# Patient Record
Sex: Female | Born: 2005
Health system: Southern US, Community
[De-identification: ages and names within clinical notes are randomized; demographics above are authoritative.]

## PROBLEM LIST (undated history)

## (undated) DIAGNOSIS — F32A Depression, unspecified: Secondary | ICD-10-CM

## (undated) DIAGNOSIS — R569 Unspecified convulsions: Secondary | ICD-10-CM

## (undated) DIAGNOSIS — N63 Unspecified lump in unspecified breast: Secondary | ICD-10-CM

## (undated) DIAGNOSIS — J45909 Unspecified asthma, uncomplicated: Secondary | ICD-10-CM

## (undated) HISTORY — PX: DENTAL SURGERY: SHX609

---
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
## 2024-01-10 DIAGNOSIS — H5212 Myopia, left eye: Secondary | ICD-10-CM | POA: Diagnosis not present

## 2024-01-11 ENCOUNTER — Encounter: Payer: Self-pay | Admitting: Family Medicine

## 2024-01-11 ENCOUNTER — Ambulatory Visit (INDEPENDENT_AMBULATORY_CARE_PROVIDER_SITE_OTHER): Payer: Commercial Managed Care - PPO | Admitting: Family Medicine

## 2024-01-11 ENCOUNTER — Encounter: Payer: Self-pay | Admitting: Neurology

## 2024-01-11 VITALS — BP 101/71 | HR 85 | Ht 63.78 in | Wt 117.5 lb

## 2024-01-11 DIAGNOSIS — Z1159 Encounter for screening for other viral diseases: Secondary | ICD-10-CM | POA: Diagnosis not present

## 2024-01-11 DIAGNOSIS — Z23 Encounter for immunization: Secondary | ICD-10-CM

## 2024-01-11 DIAGNOSIS — G40909 Epilepsy, unspecified, not intractable, without status epilepticus: Secondary | ICD-10-CM | POA: Diagnosis not present

## 2024-01-11 DIAGNOSIS — Z862 Personal history of diseases of the blood and blood-forming organs and certain disorders involving the immune mechanism: Secondary | ICD-10-CM | POA: Diagnosis not present

## 2024-01-11 DIAGNOSIS — E559 Vitamin D deficiency, unspecified: Secondary | ICD-10-CM | POA: Diagnosis not present

## 2024-01-11 DIAGNOSIS — R5383 Other fatigue: Secondary | ICD-10-CM

## 2024-01-11 DIAGNOSIS — Z113 Encounter for screening for infections with a predominantly sexual mode of transmission: Secondary | ICD-10-CM | POA: Diagnosis not present

## 2024-01-11 NOTE — Patient Instructions (Signed)
I have placed an urgent referral to neurology, I am hoping they will be able to get you in soon!  I would recommend calling the DMV to ask for an extension for your paperwork to be sent back in.  You can let them know that you are in the process of being seen by a new neurologist because of the changes in your insurance.  I would hope that they will work with you.  I will send you a message on MyChart once we have the results of your labs back.

## 2024-01-11 NOTE — Progress Notes (Signed)
Established Patient Office Visit  Subjective   Patient ID: Sheila Watson, female    DOB: [birth year]  Age: 18 y.o. MRN: [id number]  Chief Complaint  Patient presents with   Follow Up Anemia    HPI Sheila Watson is a 18 y.o. female presenting today for follow up of anemia.  Her last appointment with this office discussed repeating iron labs which were never done.  She would like to update her lab work to ensure that she does not need iron infusions again.  She also needs a referral to a neurologist within the [facility] health system.  She was previously seeing pediatric neurology at [facility] but now has a new Brewing technologist.  She reports recent fatigue and needing to sleep more.  She does have a family history of thyroid issues in addition to history of iron deficiency anemia and vitamin D deficiency.  Outpatient Medications Prior to Visit  Medication Sig   norelgestromin-ethinyl estradiol Burr Medico) 150-35 MCG/24HR transdermal patch Place 1 patch onto the skin once a week.   zonisamide (ZONEGRAN) 100 MG capsule Take 3 capsules (300 mg total) by mouth daily.   No facility-administered medications prior to visit.    ROS Negative unless otherwise noted in HPI   Objective:     BP 101/71   Pulse 85   Ht 5' 3.78" (1.62 m)   Wt 117 lb 8 oz (53.3 kg)   LMP 01/10/2024   SpO2 99%   BMI 20.31 kg/m   Physical Exam Constitutional:      General: She is not in acute distress.    Appearance: Normal appearance.  HENT:     Head: Normocephalic and atraumatic.  Cardiovascular:     Rate and Rhythm: Normal rate and regular rhythm.     Heart sounds: No murmur heard.    No friction rub. No gallop.  Pulmonary:     Effort: Pulmonary effort is normal. No respiratory distress.     Breath sounds: No wheezing, rhonchi or rales.  Skin:    General: Skin is warm and dry.  Neurological:     Mental Status: She is alert and oriented to person, place, and time.      Assessment & Plan:  Seizure  disorder [facility]) -     Ambulatory referral to Neurology  Routine screening for STI (sexually transmitted infection) -     Hepatitis C antibody; Future -     GC/Chlamydia Probe Amp; Future  History of iron deficiency anemia -     CBC with Differential/Platelet; Future -     Iron and TIBC; Future -     Ferritin; Future  Screening for viral disease -     Hepatitis C antibody; Future  Vitamin D deficiency -     VITAMIN D 25 Hydroxy (Vit-D Deficiency, Fractures); Future  Fatigue, unspecified type -     TSH Rfx on Abnormal to Free T4; Future  Need for influenza vaccination  Encounter for immunization -     Flu vaccine trivalent PF, 6mos and older(Flulaval,Afluria,Fluarix,Fluzone)  If iron levels low, recommend either iron infusions or finding a formulation that she can tolerate orally.  Referral was made to neurology, recommend calling the DMV to ask for an extension on the paperwork until she is able to be evaluated by neurology.  Most recent appointment/evaluation by pediatric neurologist was September 2023, would like to ensure that management of seizure disorder is still stable.  Return in about 4 months (around 05/10/2024) for annual physical.  Melida Quitter, PA

## 2024-01-12 ENCOUNTER — Other Ambulatory Visit: Payer: Self-pay

## 2024-01-12 ENCOUNTER — Telehealth: Payer: Self-pay | Admitting: Family Medicine

## 2024-01-12 ENCOUNTER — Encounter: Payer: Self-pay | Admitting: Family Medicine

## 2024-01-12 ENCOUNTER — Telehealth: Payer: Self-pay | Admitting: Pharmacy Technician

## 2024-01-12 ENCOUNTER — Other Ambulatory Visit: Payer: Self-pay | Admitting: Family Medicine

## 2024-01-12 DIAGNOSIS — D508 Other iron deficiency anemias: Secondary | ICD-10-CM

## 2024-01-12 DIAGNOSIS — R790 Abnormal level of blood mineral: Secondary | ICD-10-CM

## 2024-01-12 LAB — CBC WITH DIFFERENTIAL/PLATELET
Basophils Absolute: 0.1 10*3/uL (ref 0.0–0.2)
Basos: 1 %
EOS (ABSOLUTE): 0 10*3/uL (ref 0.0–0.4)
Eos: 1 %
Hematocrit: 38 % (ref 34.0–46.6)
Hemoglobin: 11.6 g/dL (ref 11.1–15.9)
Immature Grans (Abs): 0 10*3/uL (ref 0.0–0.1)
Immature Granulocytes: 0 %
Lymphocytes Absolute: 2.3 10*3/uL (ref 0.7–3.1)
Lymphs: 36 %
MCH: 25.4 pg — ABNORMAL LOW (ref 26.6–33.0)
MCHC: 30.5 g/dL — ABNORMAL LOW (ref 31.5–35.7)
MCV: 83 fL (ref 79–97)
Monocytes Absolute: 0.6 10*3/uL (ref 0.1–0.9)
Monocytes: 9 %
Neutrophils Absolute: 3.5 10*3/uL (ref 1.4–7.0)
Neutrophils: 53 %
Platelets: 240 10*3/uL (ref 150–450)
RBC: 4.57 x10E6/uL (ref 3.77–5.28)
RDW: 14.5 % (ref 11.7–15.4)
WBC: 6.4 10*3/uL (ref 3.4–10.8)

## 2024-01-12 LAB — IRON AND TIBC
Iron Saturation: 7 % — CL (ref 15–55)
Iron: 30 ug/dL (ref 27–159)
Total Iron Binding Capacity: 425 ug/dL (ref 250–450)
UIBC: 395 ug/dL (ref 131–425)

## 2024-01-12 LAB — HEPATITIS C ANTIBODY: Hep C Virus Ab: NONREACTIVE

## 2024-01-12 LAB — VITAMIN D 25 HYDROXY (VIT D DEFICIENCY, FRACTURES): Vit D, 25-Hydroxy: 31 ng/mL (ref 30.0–100.0)

## 2024-01-12 LAB — FERRITIN: Ferritin: 4 ng/mL — ABNORMAL LOW (ref 15–77)

## 2024-01-12 LAB — TSH RFX ON ABNORMAL TO FREE T4: TSH: 1.68 u[IU]/mL (ref 0.450–4.500)

## 2024-01-12 NOTE — Telephone Encounter (Addendum)
Auth Submission: NO AUTH NEEDED Site of care: Site of care: [facility] Payer: [employer] Medication & CPT/J Code(s) submitted: Venofer (Iron Sucrose) J1756 Route of submission (phone, fax, portal):  Phone # Fax # Auth type: Buy/Bill PB Units/visits requested: 5 doses Reference number:  Approval from: 01/12/24 to 06/08/24

## 2024-01-12 NOTE — Telephone Encounter (Signed)
Patient referred to infusion pharmacy team for ambulatory infusion of IV iron.  Insurance - [employer]  Dx code - D50.8  IV Iron Therapy - Venofer 200 mg IV x 5  Infusion appointments - Scheduling team will schedule patient as soon as possible.    Demetrius Charity, PharmD

## 2024-01-15 ENCOUNTER — Ambulatory Visit: Payer: Commercial Managed Care - PPO | Admitting: Family Medicine

## 2024-01-15 LAB — GC/CHLAMYDIA PROBE AMP
Chlamydia trachomatis, NAA: NEGATIVE
Neisseria Gonorrhoeae by PCR: NEGATIVE

## 2024-01-17 ENCOUNTER — Ambulatory Visit (INDEPENDENT_AMBULATORY_CARE_PROVIDER_SITE_OTHER): Payer: Commercial Managed Care - PPO

## 2024-01-17 VITALS — BP 104/68 | HR 78 | Temp 97.7°F | Resp 16 | Ht 63.0 in | Wt 118.2 lb

## 2024-01-17 DIAGNOSIS — D509 Iron deficiency anemia, unspecified: Secondary | ICD-10-CM | POA: Diagnosis not present

## 2024-01-17 DIAGNOSIS — D508 Other iron deficiency anemias: Secondary | ICD-10-CM

## 2024-01-17 MED ORDER — ACETAMINOPHEN 325 MG PO TABS
650.0000 mg | ORAL_TABLET | Freq: Once | ORAL | Status: AC
Start: 2024-01-17 — End: 2024-01-17
  Administered 2024-01-17: 650 mg via ORAL
  Filled 2024-01-17: qty 2

## 2024-01-17 MED ORDER — IRON SUCROSE 20 MG/ML IV SOLN
200.0000 mg | Freq: Once | INTRAVENOUS | Status: AC
  Administered 2024-01-17: 200 mg via INTRAVENOUS
  Filled 2024-01-17: qty 10

## 2024-01-17 MED ORDER — DIPHENHYDRAMINE HCL 25 MG PO CAPS
25.0000 mg | ORAL_CAPSULE | Freq: Once | ORAL | Status: AC
Start: 2024-01-17 — End: 2024-01-17
  Administered 2024-01-17: 25 mg via ORAL
  Filled 2024-01-17: qty 1

## 2024-01-17 NOTE — Progress Notes (Signed)
Diagnosis: Iron Deficiency Anemia  Provider:  Chilton Greathouse MD  Procedure: IV Push  IV Type: Peripheral, IV Location: R Antecubital  Venofer (Iron Sucrose), Dose: 200 mg  Post Infusion IV Care: Observation period completed and Peripheral IV Discontinued  Discharge: Condition: Good, Destination: Home . AVS Declined  Performed by:  Rico Ala, LPN

## 2024-01-19 ENCOUNTER — Other Ambulatory Visit: Payer: Self-pay

## 2024-01-19 ENCOUNTER — Ambulatory Visit: Payer: Commercial Managed Care - PPO

## 2024-01-19 VITALS — BP 95/66 | HR 78 | Temp 98.7°F | Resp 16 | Ht 63.0 in | Wt 117.2 lb

## 2024-01-19 DIAGNOSIS — D508 Other iron deficiency anemias: Secondary | ICD-10-CM

## 2024-01-19 DIAGNOSIS — D509 Iron deficiency anemia, unspecified: Secondary | ICD-10-CM | POA: Diagnosis not present

## 2024-01-19 MED ORDER — DIPHENHYDRAMINE HCL 25 MG PO CAPS
25.0000 mg | ORAL_CAPSULE | Freq: Once | ORAL | Status: AC
Start: 2024-01-19 — End: 2024-01-19
  Administered 2024-01-19: 25 mg via ORAL
  Filled 2024-01-19: qty 1

## 2024-01-19 MED ORDER — ACETAMINOPHEN 325 MG PO TABS
650.0000 mg | ORAL_TABLET | Freq: Once | ORAL | Status: AC
  Administered 2024-01-19: 650 mg via ORAL
  Filled 2024-01-19: qty 2

## 2024-01-19 MED ORDER — IRON SUCROSE 20 MG/ML IV SOLN
200.0000 mg | Freq: Once | INTRAVENOUS | Status: AC
  Administered 2024-01-19: 200 mg via INTRAVENOUS
  Filled 2024-01-19: qty 10

## 2024-01-19 NOTE — Progress Notes (Signed)
 Diagnosis: Iron Deficiency Anemia  Provider:  Chilton Greathouse MD  Procedure: IV Push  IV Type: Peripheral, IV Location: L Antecubital  Venofer (Iron Sucrose), Dose: 200 mg  Post Infusion IV Care: Observation period completed and Peripheral IV Discontinued  Discharge: Condition: Good, Destination: Home . AVS Declined  Performed by:  Loney Hering, LPN

## 2024-01-23 ENCOUNTER — Ambulatory Visit: Payer: Commercial Managed Care - PPO | Admitting: *Deleted

## 2024-01-23 VITALS — BP 100/70 | HR 79 | Temp 98.4°F | Resp 16 | Ht 63.0 in | Wt 118.6 lb

## 2024-01-23 DIAGNOSIS — D509 Iron deficiency anemia, unspecified: Secondary | ICD-10-CM

## 2024-01-23 DIAGNOSIS — D508 Other iron deficiency anemias: Secondary | ICD-10-CM

## 2024-01-23 MED ORDER — DIPHENHYDRAMINE HCL 25 MG PO CAPS
25.0000 mg | ORAL_CAPSULE | Freq: Once | ORAL | Status: AC
  Administered 2024-01-23: 25 mg via ORAL
  Filled 2024-01-23: qty 1

## 2024-01-23 MED ORDER — IRON SUCROSE 20 MG/ML IV SOLN
200.0000 mg | Freq: Once | INTRAVENOUS | Status: AC
  Administered 2024-01-23: 200 mg via INTRAVENOUS
  Filled 2024-01-23: qty 10

## 2024-01-23 MED ORDER — ACETAMINOPHEN 325 MG PO TABS
650.0000 mg | ORAL_TABLET | Freq: Once | ORAL | Status: AC
  Administered 2024-01-23: 650 mg via ORAL
  Filled 2024-01-23: qty 2

## 2024-01-23 NOTE — Progress Notes (Signed)
 Diagnosis: Iron Deficiency Anemia  Provider:  Chilton Greathouse MD  Procedure: IV Push  IV Type: Peripheral, IV Location: R Antecubital  Venofer (Iron Sucrose), Dose: 200 mg  Post Infusion IV Care: Observation period completed and Peripheral IV Discontinued  Discharge: Condition: Good, Destination: Home . AVS Declined  Performed by:  Forrest Moron, RN

## 2024-01-25 ENCOUNTER — Ambulatory Visit: Payer: Commercial Managed Care - PPO

## 2024-01-25 MED ORDER — DIPHENHYDRAMINE HCL 25 MG PO CAPS: 25.0000 mg | ORAL_CAPSULE | Freq: Once | ORAL | Status: DC

## 2024-01-25 MED ORDER — IRON SUCROSE 20 MG/ML IV SOLN: 200.0000 mg | Freq: Once | INTRAVENOUS | Status: DC

## 2024-01-25 MED ORDER — ACETAMINOPHEN 325 MG PO TABS: 650.0000 mg | ORAL_TABLET | Freq: Once | ORAL | Status: DC

## 2024-01-26 ENCOUNTER — Encounter: Payer: Self-pay | Admitting: Family Medicine

## 2024-01-30 ENCOUNTER — Ambulatory Visit (INDEPENDENT_AMBULATORY_CARE_PROVIDER_SITE_OTHER): Payer: Commercial Managed Care - PPO | Admitting: Family Medicine

## 2024-01-30 ENCOUNTER — Encounter: Payer: Self-pay | Admitting: Family Medicine

## 2024-01-30 VITALS — BP 104/71 | HR 80 | Ht 63.0 in | Wt 119.4 lb

## 2024-01-30 DIAGNOSIS — N63 Unspecified lump in unspecified breast: Secondary | ICD-10-CM

## 2024-01-30 NOTE — Assessment & Plan Note (Signed)
Small nodular lump directly above the left areola noted on exam.  Similarly sized nodule on the right breast in the upper inner quadrant.  Patient had has noticed the nodule in the right breast.  Most likely fibroadenoma, but will get ultrasound for further evaluation.  Further management depends on ultrasound results.

## 2024-01-30 NOTE — Patient Instructions (Signed)
It was nice to see you today,  We addressed the following topics today: -I am going to order an ultrasound of your breast to evaluate the lump and determine what the next step in management will be - Someone should call you to schedule this in the next 3 to 4 weeks.  If you not hear from somebody let us know.  Have a great day,  Frederic Jericho, MD

## 2024-01-30 NOTE — Progress Notes (Signed)
   Acute Office Visit  Subjective:     Patient ID: Sheila Watson, female    DOB: 02-Sep-2006, 18 y.o.   MRN: [id number]  Chief Complaint  Patient presents with   Breast Pain    HPI Patient is in today for breast lump.  Patient first noticed this approximately 1 year ago and brought it up to her obstetrician.  An exam was done and she was reassured that it was "fibrous tissue".  1 week ago patient noticed sharp pain when lying on her back in the area of the lump.  Pain generally lasts a few seconds to few minutes.  Generally occurs when she is lying on her back.  Patient has a child and has breast-fed in the past.  She notes some clear discharge if she squeezes the nipple but otherwise no discharge.  ROS      Objective:    BP 104/71   Pulse 80   Ht 5\' 3"  (1.6 m)   Wt 119 lb 6.4 oz (54.2 kg)   LMP 01/10/2024   SpO2 100%   BMI 21.15 kg/m    Physical Exam General: Alert, oriented Breast: Patient has a small nodule directly above the left areola approximately 1 cm in diameter.  Similarly sized nodule in the right breast approximately 6 to 7 cm above the areola in the 1 o'clock position.  No other abnormalities noted.  No rash or erythema.  No axillary lymphadenopathy.  No results found for any visits on 01/30/24.      Assessment & Plan:   Mass of breast, unspecified laterality Assessment & Plan: Small nodular lump directly above the left areola noted on exam.  Similarly sized nodule on the right breast in the upper inner quadrant.  Patient had has noticed the nodule in the right breast.  Most likely fibroadenoma, but will get ultrasound for further evaluation.  Further management depends on ultrasound results.  Orders: -     [place] LIMITED ULTRASOUND INCLUDING AXILLA LEFT BREAST ; Future -     US BREAST COMPLETE UNI RIGHT INC AXILLA; Future     Return in about 2 months (around 03/29/2024) for breast lump.  Sandre Kitty, MD

## 2024-01-31 ENCOUNTER — Ambulatory Visit: Payer: Commercial Managed Care - PPO

## 2024-01-31 VITALS — BP 101/68 | HR 85 | Temp 98.0°F | Resp 16 | Ht 63.0 in | Wt 119.8 lb

## 2024-01-31 DIAGNOSIS — D509 Iron deficiency anemia, unspecified: Secondary | ICD-10-CM | POA: Diagnosis not present

## 2024-01-31 DIAGNOSIS — D508 Other iron deficiency anemias: Secondary | ICD-10-CM

## 2024-01-31 MED ORDER — IRON SUCROSE 20 MG/ML IV SOLN
200.0000 mg | Freq: Once | INTRAVENOUS | Status: AC
  Administered 2024-01-31: 200 mg via INTRAVENOUS
  Filled 2024-01-31: qty 10

## 2024-01-31 MED ORDER — DIPHENHYDRAMINE HCL 25 MG PO CAPS
25.0000 mg | ORAL_CAPSULE | Freq: Once | ORAL | Status: AC
  Administered 2024-01-31: 25 mg via ORAL
  Filled 2024-01-31: qty 1

## 2024-01-31 MED ORDER — ACETAMINOPHEN 325 MG PO TABS
650.0000 mg | ORAL_TABLET | Freq: Once | ORAL | Status: AC
  Administered 2024-01-31: 650 mg via ORAL
  Filled 2024-01-31: qty 2

## 2024-01-31 NOTE — Progress Notes (Signed)
Diagnosis: Iron Deficiency Anemia  Provider:  Chilton Greathouse MD  Procedure: IV Push  IV Type: Peripheral, IV Location: R Antecubital  Venofer (Iron Sucrose), Dose: 200 mg  Post Infusion IV Care: Observation period completed and Peripheral IV Discontinued  Discharge: Condition: Good, Destination: Home . AVS Declined  Performed by:  Loney Hering, LPN

## 2024-02-07 ENCOUNTER — Ambulatory Visit: Payer: Commercial Managed Care - PPO

## 2024-02-14 ENCOUNTER — Ambulatory Visit: Payer: Commercial Managed Care - PPO

## 2024-02-14 MED ORDER — ACETAMINOPHEN 325 MG PO TABS: 650.0000 mg | ORAL_TABLET | Freq: Once | ORAL | Status: DC

## 2024-02-14 MED ORDER — DIPHENHYDRAMINE HCL 25 MG PO CAPS: 25.0000 mg | ORAL_CAPSULE | Freq: Once | ORAL | Status: DC

## 2024-02-15 ENCOUNTER — Encounter: Payer: Self-pay | Admitting: Family Medicine

## 2024-02-16 ENCOUNTER — Other Ambulatory Visit (HOSPITAL_COMMUNITY): Payer: Self-pay

## 2024-02-16 ENCOUNTER — Encounter: Payer: Self-pay | Admitting: Neurology

## 2024-02-16 ENCOUNTER — Ambulatory Visit (INDEPENDENT_AMBULATORY_CARE_PROVIDER_SITE_OTHER): Payer: Commercial Managed Care - PPO | Admitting: Neurology

## 2024-02-16 VITALS — BP 105/68 | HR 95 | Ht 63.0 in | Wt 121.6 lb

## 2024-02-16 DIAGNOSIS — G40B09 Juvenile myoclonic epilepsy, not intractable, without status epilepticus: Secondary | ICD-10-CM | POA: Diagnosis not present

## 2024-02-16 MED ORDER — ZONISAMIDE 100 MG PO CAPS
ORAL_CAPSULE | ORAL | 3 refills | Status: DC
  Filled 2024-02-16 – 2024-03-02 (×2): qty 270, 90d supply, fill #0

## 2024-02-16 NOTE — Progress Notes (Signed)
 NEUROLOGY CONSULTATION NOTE  Sheila Watson MRN: [id number] DOB: 05-04-2006  Referring provider: Saralyn Pilar, PA Primary care provider: Dr. Lenor Coffin  Reason for consult:  establish adult epilepsy care  Thank you for your kind referral of Sheila Watson for consultation of the above symptoms. Although her history is well known to you, please allow me to reiterate it for the purpose of our medical record. She is alone in the office today. Records and images were personally reviewed where available.   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 Records from her pediatric neurologist at [facility], Dr. Irene Limbo, were reviewed. Her first and only generalized convulsion was a [place]urnal [place]. She was noted to be unresponsive with gurgling noises, stiff extremities with jerking, foaming at the mouth, for 1-2 minutes. She was disoriented after with note of tongue bite. EEG in 11/2020 reported occasional bursts of sharply contoured high amplitude generalized slowing, photoparoxysmal responses that did not outlast IPS with a run of spike and slow waves maximal in the occipital leads O1>O2 where she did not remember a verbal prompt. MRI brain with and without contrast at [facility] in 12/2020 was normal.   She reports that prior to the convulsion, she was having episodes of myoclonus with quick upper body jerks when exposed to sunlight through the trees in car rides. When younger, her mother had noticed she would have brief staring. She denies any further myoclonus since starting seizure medications. She denies any unresponsive episodes, gaps in time, olfactory/gustatory hallucinations, rising epigastric sensation, focal numbness/tingling/weakness. She was initially on Keppra XR, then switched to Zonisamide in 2022 due to "becoming hateful" on the Keppra. She  has been tolerating the Zonisamide 300mg  qhs without side effects. She denies any headaches, dizziness, diplopia, dysarthria, dysphagia, neck/back pain, bowel/bladder dysfunction. No anosmia, tremors, no falls. She usually gets 8-10 hours of sleep. No alcohol. She is finishing high school online this year. Memory is pretty good. Mood is good, sometimes she gets irritable. She lives with her mother, brother, and 41 year old son [name]. She is driving.   Epilepsy Risk Factors:  Her maternal grandmother had one seizure in early adolescence, mother's cousin had multiple seizures as an adult, 2 maternal cousins have had (?febrile) seizures as infants and toddlers. She had a normal birth and early development.  There is no history of febrile convulsions, CNS infections such as meningitis/encephalitis, significant traumatic brain injury, neurosurgical procedures.  Prior AEDs: Keppra XR (mood changes)  Diagnostic Data: EEG in 11/2020 reported occasional bursts of sharply contoured high amplitude generalized slowing, photoparoxysmal responses that did not outlast IPS with a run of spike and slow waves maximal in the occipital leads O1>O2 where she did not remember a verbal prompt.   MRI brain with and without contrast at [facility] in 12/2020 was normal.    PAST MEDICAL HISTORY: Past Medical History:  Diagnosis Date   Seizures (HCC)     PAST SURGICAL HISTORY: Past Surgical History:  Procedure Laterality Date   DENTAL SURGERY      MEDICATIONS: Current Outpatient Medications on File Prior to Visit  Medication Sig Dispense Refill   norelgestromin-ethinyl estradiol Burr Medico) 150-35 MCG/24HR transdermal patch Place 1 patch onto the skin once a week. 9 patch 4   zonisamide (ZONEGRAN) 100 MG capsule Take 3 capsules (300 mg total) by mouth daily. 90 capsule 5  No current facility-administered medications on file prior to visit.    ALLERGIES: Allergies  Allergen Reactions   Sulfa Antibiotics Rash    Sulfasalazine Rash    FAMILY HISTORY: Family History  Problem Relation Age of Onset   Seizures Maternal Grandmother    Heart disease Maternal Grandfather    Cancer Neg Hx    Diabetes Neg Hx    Hypertension Neg Hx     SOCIAL HISTORY: Social History   Socioeconomic History   Marital status: Single    Spouse name: Not on file   Number of children: Not on file   Years of education: Not on file   Highest education level: Not on file  Occupational History   Not on file  Tobacco Use   Smoking status: Never    Passive exposure: Never   Smokeless tobacco: Never  Vaping Use   Vaping status: Never Used  Substance and Sexual Activity   Alcohol use: No   Drug use: No   Sexual activity: Yes  Other Topics Concern   Not on file  Social History Narrative   Are you right handed or left handed? Right    Are you currently employed ? 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 No    Who lives with you? Lives with mom    What type of home do you live in: 1 story or 2 story? 1 story        Social Drivers of Corporate investment banker Strain: Not on file  Food Insecurity: Not on file  Transportation Needs: Not on file  Physical Activity: Not on file  Stress: Not on file  Social Connections: Unknown (02/14/2023)   Received from [facility], [facility]   Social Network    Social Network: Not on file  Intimate Partner Violence: Unknown (02/14/2023)   Received from [facility], [facility]   HITS    Physically Hurt: Not on file    Insult or Talk Down To: Not on file    Threaten Physical Harm: Not on file    Scream or Curse: Not on file     PHYSICAL EXAM: Vitals:   02/16/24 0850  BP: 105/68  Pulse: 95  SpO2: 98%   General: No acute distress Head:  Normocephalic/atraumatic Skin/Extremities: No rash, no edema Neurological Exam: Mental status: alert and oriented to person, place, and time, no dysarthria or aphasia, Fund of  knowledge is appropriate.  Recent and remote memory are intact, 3/3 delayed recall.  Attention and concentration are normal, 5/5 WORLD backwards.  Cranial nerves: CN I: not tested CN II: pupils equal, round, visual fields intact CN III, IV, VI:  full range of motion, no nystagmus, no ptosis CN V: facial sensation intact CN VII: upper and lower face symmetric CN VIII: hearing intact to conversation Bulk & Tone: normal, no fasciculations. Motor: 5/5 throughout with no pronator drift. Sensation: intact to light touch, cold, pin, vibration sense.  No extinction to double simultaneous stimulation.  Romberg test negative Deep Tendon Reflexes: +2 throughout Cerebellar: no incoordination on finger to nose testing Gait: narrow-based and steady, able to tandem walk adequately. 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 EEG in 2021 showed photoparoxysmal response. MRI brain normal. No further convulsions or myoclonic jerks since 04/2020. She is doing well on Zonisamide 300mg  at  bedtime. We discussed concerns about Zonisamide causing contraception failure in the past, on review of interactions, it is level 4 (minor), with Zonisamide potentially increasing the serum concentration of ethinyl estradiol. Would recommend continuation of medication, as well as starting folic acid 1mg  daily if sexually active. We discussed issues in women with epilepsy. She has DMV paperwork, baseline Zonisamide level will be ordered. She is aware of [place] driving laws o stop driving after a seizure, until 6 months seizure-free. Follow-up in 1 year, call for any changes.    Thank you for allowing me to participate in the care of this patient. Please do not hesitate to call for any questions or concerns.   Patrcia Dolly, M.D.  CC: Dr. Constance Goltz, Saralyn Pilar, [place]

## 2024-02-16 NOTE — Patient Instructions (Signed)
 Good to meet you.  Have bloodwork done for Zonisamide level  2. Continue Zonisamide 100mg : Take 3 capsules every night  3. Start a daily folic acid 1mg  tablet  4. Follow-up in 1 year, call for any changes   Seizure Precautions: 1. If medication has been prescribed for you to prevent seizures, take it exactly as directed.  Do not stop taking the medicine without talking to your doctor first, even if you have not had a seizure in a long time.   2. Avoid activities in which a seizure would cause danger to yourself or to others.  Don't operate dangerous machinery, swim alone, or climb in high or dangerous places, such as on ladders, roofs, or girders.  Do not drive unless your doctor says you may.  3. If you have any warning that you may have a seizure, lay down in a safe place where you can't hurt yourself.    4.  No driving for 6 months from last seizure, as per [facility].   Please refer to the following link on the Epilepsy Foundation of America's website for more information: http://www.epilepsyfoundation.org/answerplace/Social/driving/drivingu.cfm   5.  Maintain good sleep hygiene. Avoid alcohol.  6.  Notify your neurology if you are planning pregnancy or if you become pregnant.  7.  Contact your doctor if you have any problems that may be related to the medicine you are taking.  8.  Call 911 and bring the patient back to the ED if:        A.  The seizure lasts longer than 5 minutes.       B.  The patient doesn't awaken shortly after the seizure  C.  The patient has new problems such as difficulty seeing, speaking or moving  D.  The patient was injured during the seizure  E.  The patient has a temperature over 102 F (39C)  F.  The patient vomited and now is having trouble breathing

## 2024-02-17 ENCOUNTER — Emergency Department (HOSPITAL_COMMUNITY)
Admission: EM | Admit: 2024-02-17 | Discharge: 2024-02-17 | Disposition: A | Discharge status: Home / Self Care | Attending: Emergency Medicine | Admitting: Emergency Medicine

## 2024-02-17 ENCOUNTER — Other Ambulatory Visit: Payer: Self-pay

## 2024-02-17 DIAGNOSIS — R569 Unspecified convulsions: Secondary | ICD-10-CM | POA: Diagnosis present

## 2024-02-17 DIAGNOSIS — Z91148 Patient's other noncompliance with medication regimen for other reason: Secondary | ICD-10-CM | POA: Diagnosis not present

## 2024-02-17 DIAGNOSIS — R000 Tachycardia, unspecified: Secondary | ICD-10-CM | POA: Diagnosis not present

## 2024-02-17 DIAGNOSIS — R9431 Abnormal electrocardiogram [ECG] [EKG]: Secondary | ICD-10-CM | POA: Diagnosis not present

## 2024-02-17 LAB — COMPREHENSIVE METABOLIC PANEL
ALT: 15 U/L (ref 0–44)
AST: 15 U/L (ref 15–41)
Albumin: 3.6 g/dL (ref 3.5–5.0)
Alkaline Phosphatase: 45 U/L (ref 38–126)
Anion gap: 7 (ref 5–15)
BUN: 13 mg/dL (ref 6–20)
CO2: 25 mmol/L (ref 22–32)
Calcium: 8.2 mg/dL — ABNORMAL LOW (ref 8.9–10.3)
Chloride: 105 mmol/L (ref 98–111)
Creatinine, Ser: 0.62 mg/dL (ref 0.44–1.00)
GFR, Estimated: >60 mL/min (ref >60)
Glucose, Bld: 76 mg/dL (ref 70–99)
Potassium: 3.6 mmol/L (ref 3.5–5.1)
Sodium: 137 mmol/L (ref 135–145)
Total Bilirubin: 1.6 mg/dL — ABNORMAL HIGH (ref 0.0–1.2)
Total Protein: 7 g/dL (ref 6.5–8.1)

## 2024-02-17 LAB — URINALYSIS, ROUTINE W REFLEX MICROSCOPIC
Bilirubin Urine: NEGATIVE
Glucose, UA: NEGATIVE mg/dL
Ketones, ur: NEGATIVE mg/dL
Nitrite: NEGATIVE
Protein, ur: 30 mg/dL — AB
Specific Gravity, Urine: 1.026 (ref 1.005–1.030)
pH: 5 (ref 5.0–8.0)

## 2024-02-17 LAB — CBG MONITORING, ED: Glucose-Capillary: 82 mg/dL (ref 70–99)

## 2024-02-17 LAB — CBC WITH DIFFERENTIAL/PLATELET
Abs Immature Granulocytes: 0.05 10*3/uL (ref 0.00–0.07)
Basophils Absolute: 0.1 10*3/uL (ref 0.0–0.1)
Basophils Relative: 1 %
Eosinophils Absolute: 0 10*3/uL (ref 0.0–0.5)
Eosinophils Relative: 0 %
HCT: 40.9 % (ref 36.0–46.0)
Hemoglobin: 12.4 g/dL (ref 12.0–15.0)
Immature Granulocytes: 1 %
Lymphocytes Relative: 22 %
Lymphs Abs: 2.2 10*3/uL (ref 0.7–4.0)
MCH: 27.8 pg (ref 26.0–34.0)
MCHC: 30.3 g/dL (ref 30.0–36.0)
MCV: 91.7 fL (ref 80.0–100.0)
Monocytes Absolute: 0.7 10*3/uL (ref 0.1–1.0)
Monocytes Relative: 7 %
Neutro Abs: 6.9 10*3/uL (ref 1.7–7.7)
Neutrophils Relative %: 69 %
Platelets: 176 10*3/uL (ref 150–400)
RBC: 4.46 MIL/uL (ref 3.87–5.11)
RDW: 18 % — ABNORMAL HIGH (ref 11.5–15.5)
WBC: 9.8 10*3/uL (ref 4.0–10.5)
nRBC: 0 % (ref 0.0–0.2)

## 2024-02-17 LAB — HCG, SERUM, QUALITATIVE: Preg, Serum: NEGATIVE

## 2024-02-17 MED ORDER — ONDANSETRON HCL 4 MG/2ML IJ SOLN
4.0000 mg | Freq: Once | INTRAMUSCULAR | Status: AC
  Administered 2024-02-17: 4 mg via INTRAVENOUS
  Filled 2024-02-17: qty 2

## 2024-02-17 MED ORDER — ACETAMINOPHEN 500 MG PO TABS
500.0000 mg | ORAL_TABLET | Freq: Once | ORAL | Status: AC
  Administered 2024-02-17: 500 mg via ORAL
  Filled 2024-02-17: qty 1

## 2024-02-17 MED ORDER — ZONISAMIDE 100 MG PO CAPS
300.0000 mg | ORAL_CAPSULE | Freq: Once | ORAL | Status: AC
  Administered 2024-02-17: 300 mg via ORAL
  Filled 2024-02-17: qty 3

## 2024-02-17 NOTE — Discharge Instructions (Signed)
 Continue to take your seizure medicine.  Take your normal dose tonight.  Call your neurologist about potential changes in medicine if you are worried about it.

## 2024-02-17 NOTE — ED Triage Notes (Signed)
 Pt presents to the ED via [facility] with complaints of seizure-like activity that lasted ~ 2 mins PTA per the patients BF. Hx of epilepsy and is followed by Neurology. Per Mom, the patient hasn't had seizure like activity since 2021. Pt is prescribed Zonisamide for seizures but has been non-compliant over the last few weeks. A&Ox4 at this time. Denies CP or SOB.    CBG-89 108/68 90 99% RA 18RAC

## 2024-02-17 NOTE — ED Provider Notes (Signed)
 [place] EMERGENCY DEPARTMENT AT [facility] Provider Note   CSN: [id number] Arrival date & time: 02/17/24  6578     History  Chief Complaint  Patient presents with   Seizures    Sheila Watson is a 18 y.o. female.   Seizures Patient presents with seizure.  History of same.  Saw neurology yesterday.  Had been on Zonegran.  However patient has not been taking the medicine.  States she had not had a seizure and 3 to 4 years have been taking it.  Did have seizure last night.  Typical tonic-clonic seizure.  Does have some nausea at this time.  There was talk about potentially changing seizure medicine but had not been done.  Denies pregnancy.    Past Medical History:  Diagnosis Date   Seizures (HCC)     Home Medications Prior to Admission medications   Medication Sig Start Date End Date Taking? Authorizing Provider  norelgestromin-ethinyl estradiol Burr Medico) 150-35 MCG/24HR transdermal patch Place 1 patch onto the skin once a week. 08/11/23     zonisamide (ZONEGRAN) 100 MG capsule Take 3 capsules every night 02/16/24   Van Clines, MD      Allergies    Sulfa antibiotics and Sulfasalazine    Review of Systems   Review of Systems  Neurological:  Positive for seizures.    Physical Exam Updated Vital Signs BP 106/66   Pulse 74   Temp 98.3 F (36.8 C) (Oral)   Resp 19   Ht 5\' 3"  (1.6 m)   Wt 54.4 kg   SpO2 99%   BMI 21.26 kg/m  Physical Exam Vitals and nursing note reviewed.  Cardiovascular:     Rate and Rhythm: Regular rhythm.  Pulmonary:     Breath sounds: No wheezing.  Abdominal:     Tenderness: There is no abdominal tenderness.  Musculoskeletal:     Cervical back: Neck supple.  Skin:    Capillary Refill: Capillary refill takes less than 2 seconds.  Neurological:     Mental Status: She is alert. Mental status is at baseline.     ED Results / Procedures / Treatments   Labs (all labs ordered are listed, but only abnormal results are  displayed) Labs Reviewed  COMPREHENSIVE METABOLIC PANEL - Abnormal; Notable for the following components:      Result Value   Calcium 8.2 (*)    Total Bilirubin 1.6 (*)    All other components within normal limits  CBC WITH DIFFERENTIAL/PLATELET - Abnormal; Notable for the following components:   RDW 18.0 (*)    All other components within normal limits  HCG, SERUM, QUALITATIVE  URINALYSIS, ROUTINE W REFLEX MICROSCOPIC  CBG MONITORING, ED    EKG EKG Interpretation Date/Time:  Saturday February 17 2024 06:26:46 EST Ventricular Rate:  76 PR Interval:  159 QRS Duration:  85 QT Interval:  381 QTC Calculation: 429 R Axis:   63  Text Interpretation: Sinus rhythm RSR' in V1 or V2, probably normal variant Confirmed by Nicanor Alcon, April ([id number]) on 02/17/2024 6:41:21 AM  Radiology No results found.  Procedures Procedures    Medications Ordered in ED Medications  zonisamide (ZONEGRAN) capsule 300 mg (300 mg Oral Given 02/17/24 0741)  ondansetron (ZOFRAN) injection 4 mg (4 mg Intravenous Given 02/17/24 0741)  acetaminophen (TYLENOL) tablet 500 mg (500 mg Oral Given 02/17/24 0845)    ED Course/ Medical Decision Making/ A&P  Medical Decision Making Amount and/or Complexity of Data Reviewed Labs: ordered.  Risk OTC drugs. Prescription drug management.   Patient with seizure activity.  History of same.  Noncompliant with her medication.  No apparent injury.  I think most likely cause is the noncompliance.  Will give oral Zonegran here and monitor.  Blood work reassuring.  Not pregnant.  No further seizures.  Gave dose here and can have dose at home again tonight.  Discharge home.  Will call neurology about potentially changing her medicine if she is uncomfortable with what she has.        Final Clinical Impression(s) / ED Diagnoses Final diagnoses:  Seizure (HCC)  Noncompliance w/medication treatment due to intermit use of medication    Rx / DC  Orders ED Discharge Orders     None         Benjiman Core, MD 02/17/24 [phone]

## 2024-02-21 ENCOUNTER — Other Ambulatory Visit: Payer: Self-pay | Admitting: Family Medicine

## 2024-02-21 ENCOUNTER — Ambulatory Visit
Admission: RE | Admit: 2024-02-21 | Discharge: 2024-02-21 | Disposition: A | Discharge status: Home / Self Care | Payer: Commercial Managed Care - PPO | Source: Ambulatory Visit | Attending: Family Medicine | Admitting: Family Medicine

## 2024-02-21 ENCOUNTER — Ambulatory Visit
Admission: RE | Admit: 2024-02-21 | Discharge: 2024-02-21 | Disposition: A | Discharge status: Home / Self Care | Payer: Commercial Managed Care - PPO | Source: Ambulatory Visit | Attending: Family Medicine

## 2024-02-21 DIAGNOSIS — N6322 Unspecified lump in the left breast, upper inner quadrant: Secondary | ICD-10-CM | POA: Diagnosis not present

## 2024-02-21 DIAGNOSIS — N63 Unspecified lump in unspecified breast: Secondary | ICD-10-CM

## 2024-02-21 DIAGNOSIS — N6310 Unspecified lump in the right breast, unspecified quadrant: Secondary | ICD-10-CM | POA: Diagnosis not present

## 2024-02-26 ENCOUNTER — Encounter: Payer: Self-pay | Admitting: Neurology

## 2024-02-26 ENCOUNTER — Ambulatory Visit (INDEPENDENT_AMBULATORY_CARE_PROVIDER_SITE_OTHER): Payer: Self-pay | Admitting: Neurology

## 2024-02-26 ENCOUNTER — Other Ambulatory Visit (HOSPITAL_COMMUNITY): Payer: Self-pay

## 2024-02-26 VITALS — BP 101/70 | HR 86 | Ht 63.0 in | Wt 120.0 lb

## 2024-02-26 DIAGNOSIS — G40B09 Juvenile myoclonic epilepsy, not intractable, without status epilepticus: Secondary | ICD-10-CM | POA: Diagnosis not present

## 2024-02-26 MED ORDER — VALTOCO 15 MG DOSE 2 X 7.5 MG/0.1ML NA LQPK
NASAL | 5 refills | Status: DC
  Filled 2024-02-26: qty 10, 30d supply, fill #0

## 2024-02-26 NOTE — Patient Instructions (Addendum)
 I hope you start feeling better soon. Continue Zonisamide 100mg : take 3 capsules every night. Refills sent for Valtoco nasal spray for seizure rescue.  Schedule 1-hour EEG  Here is the information for the EpiMonitor watch: [email or web address]  Follow-up in 3 months, call for any changes   Seizure Precautions: 1. If medication has been prescribed for you to prevent seizures, take it exactly as directed.  Do not stop taking the medicine without talking to your doctor first, even if you have not had a seizure in a long time.   2. Avoid activities in which a seizure would cause danger to yourself or to others.  Don't operate dangerous machinery, swim alone, or climb in high or dangerous places, such as on ladders, roofs, or girders.  Do not drive unless your doctor says you may.  3. If you have any warning that you may have a seizure, lay down in a safe place where you can't hurt yourself.    4.  No driving for 6 months from last seizure, as per [facility].   Please refer to the following link on the Epilepsy Foundation of America's website for more information: http://www.epilepsyfoundation.org/answerplace/Social/driving/drivingu.cfm   5.  Maintain good sleep hygiene. Avoid alcohol.  6.  Notify your neurology if you are planning pregnancy or if you become pregnant.  7.  Contact your doctor if you have any problems that may be related to the medicine you are taking.  8.  Call 911 and bring the patient back to the ED if:        A.  The seizure lasts longer than 5 minutes.       B.  The patient doesn't awaken shortly after the seizure  C.  The patient has new problems such as difficulty seeing, speaking or moving  D.  The patient was injured during the seizure  E.  The patient has a temperature over 102 F (39C)  F.  The patient vomited and now is having trouble breathing

## 2024-02-26 NOTE — Progress Notes (Signed)
 NEUROLOGY FOLLOW UP OFFICE NOTE  Sheila Watson [id number] [birth year]  HISTORY OF PRESENT ILLNESS: I had the pleasure of seeing Sheila Watson in follow-up in the neurology clinic on 02/26/2024.  The patient was last seen 2 weeks ago for JME and presents for an urgent visit after a breakthrough seizure on 02/17/24 after being seizure-free for 4 years. She is accompanied by her mother who helps supplement the history today.  Records and images were personally reviewed where available.  On her initial visit, she reported taking Zonisamide 300mg  at bedtime. Today she admits she has not been taking it for a year. The seizure on 3/1 occurred in her sleep, she was not feeling good that day and took a nap. No tongue bite, incontinence, focal weakness. Since the seizure, she has been having trouble remembering things and having difficulty putting words together. She denies any myoclonic jerks but her mother remembers she was complaining of feeling funny again in the car with light coming through the trees. Her mother also noticed a little bit of staring. She has been taking Zonisamide 300mg  at bedtime since the seizure.    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 Records from her pediatric neurologist at [facility], Sheila Watson, were reviewed. Her first and only generalized convulsion was a nocturnal seizure [place]20 [place]. She was noted to be unresponsive with gurgling noises, stiff extremities with jerking, foaming at the mouth, for 1-2 minutes. She was disoriented after with note of tongue bite. EEG in 11/2020 reported occasional bursts of sharply contoured high amplitude generalized slowing, photoparoxysmal responses that did not outlast IPS with a run of spike and slow waves maximal in the occipital leads O1>O2 where she did not remember a verbal prompt. MRI brain with  and without contrast at [facility] in 12/2020 was normal.   She reports that prior to the convulsion, she was having episodes of myoclonus with quick upper body jerks when exposed to sunlight through the trees in car rides. When younger, her mother had noticed she would have brief staring. She denies any further myoclonus since starting seizure medications. She denies any unresponsive episodes, gaps in time, olfactory/gustatory hallucinations, rising epigastric sensation, focal numbness/tingling/weakness. She was initially on Keppra XR, then switched to Zonisamide in 2022 due to "becoming hateful" on the Keppra. She has been tolerating the Zonisamide 300mg  qhs without side effects. She denies any headaches, dizziness, diplopia, dysarthria, dysphagia, neck/back pain, bowel/bladder dysfunction. No anosmia, tremors, no falls. She usually gets 8-10 hours of sleep. No alcohol. She is finishing high school online this year. Memory is pretty good. Mood is good, sometimes she gets irritable. She lives with her mother, brother, and 26 year old son [name]. She is driving.   Epilepsy Risk Factors:  Her maternal grandmother had one seizure in early adolescence, mother's cousin had multiple seizures as an adult, 2 maternal cousins have had (?febrile) seizures as infants and toddlers. She had a normal birth and early development.  There is no history of febrile convulsions, CNS infections such as meningitis/encephalitis, significant traumatic brain injury, neurosurgical procedures.  Prior AEDs: Keppra XR (mood changes)  Diagnostic Data: EEG in 11/2020 reported occasional bursts of sharply contoured high amplitude generalized slowing, photoparoxysmal responses that did not outlast IPS with a run of spike and slow waves maximal in the occipital leads O1>O2 where she did not remember a verbal prompt.  MRI brain with and without contrast at [facility] in 12/2020 was normal.    PAST MEDICAL HISTORY: Past Medical  History:  Diagnosis Date   Seizures (HCC)     MEDICATIONS: Current Outpatient Medications on File Prior to Visit  Medication Sig Dispense Refill   norelgestromin-ethinyl estradiol [facility]) 150-35 MCG/24HR transdermal patch Place 1 patch onto the skin once a week. 9 patch 4   zonisamide (ZONEGRAN) 100 MG capsule Take 3 capsules every night 270 capsule 3   No current facility-administered medications on file prior to visit.    ALLERGIES: Allergies  Allergen Reactions   Sulfa Antibiotics Rash   Sulfasalazine Rash    FAMILY HISTORY: Family History  Problem Relation Age of Onset   Seizures Maternal Grandmother    Heart disease Maternal Grandfather    Cancer Neg Hx    Diabetes Neg Hx    Hypertension Neg Hx     SOCIAL HISTORY: Social History   Socioeconomic History   Marital status: Single    Spouse name: Not on file   Number of children: Not on file   Years of education: Not on file   Highest education level: Not on file  Occupational History   Not on file  Tobacco Use   Smoking status: Never    Passive exposure: Never   Smokeless tobacco: Never  Vaping Use   Vaping status: Never Used  Substance and Sexual Activity   Alcohol use: No   Drug use: No   Sexual activity: Yes  Other Topics Concern   Not on file  Social History Narrative   Are you right handed or left handed? Right    Are you currently employed ? 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 No    Who lives with you? Lives with mom    What type of home do you live in: 1 story or 2 story? 1 story        Social Drivers of Corporate investment banker Strain: Not on file  Food Insecurity: Not on file  Transportation Needs: Not on file  Physical Activity: Not on file  Stress: Not on file  Social Connections: Unknown (02/14/2023)   Received from [facility], [facility]   Social Network    Social Network: Not on file  Intimate Partner Violence: Unknown  (02/14/2023)   Received from [facility], [facility]   HITS    Physically Hurt: Not on file    Insult or Talk Down To: Not on file    Threaten Physical Harm: Not on file    Scream or Curse: Not on file     PHYSICAL EXAM: Vitals:   02/26/24 1414  BP: 101/70  Pulse: 86  SpO2: 99%   General: No acute distress, tearful Head:  Normocephalic/atraumatic Skin/Extremities: No rash, no edema Neurological Exam: alert and awake. No aphasia or dysarthria. Fund of knowledge is appropriate.  Attention and concentration are normal.   Cranial nerves: Pupils equal, round. Extraocular movements intact.  No facial asymmetry.  Motor: moves all extremities symmetrically at least anti-gravity x 4. Gait narrow-based and steady   IMPRESSION: This is a pleasant 18 yo RH woman with Juvenile Myoclonic Epilepsy. EEG in 2021 showed photoparoxysmal response. MRI brain normal. She had been seizure-free for almost 4 years until a nocturnal convulsion on 02/17/24. She now admits that she had stopped the Zonisamide a year ago but restarted it after the  seizure. Since the seizure, she continues to have trouble remembering and word-finding difficulties. A 1-hour EEG will be ordered. Continue Zonisamide 300mg  at bedtime, we discussed the importance of medication compliance. She was understandably upset, particularly about driving restrictions. We discussed prognosis of JME, 90% are controlled on medication. We discussed the importance of avoiding seizure triggers, including missing medication, sleep deprivation, alcohol. She was given a prescription for prn Valtoco for seizure rescue, side effects discussed. They were interested in a seizure alert device, information for EpiMonitor provided today. Follow-up in 3 months, call for any changes.   Thank you for allowing me to participate in her care.  Please do not hesitate to call for any questions or concerns.   Patrcia Dolly, M.D.   CC: Dr. Constance Goltz

## 2024-02-28 ENCOUNTER — Ambulatory Visit (INDEPENDENT_AMBULATORY_CARE_PROVIDER_SITE_OTHER): Payer: Self-pay | Admitting: Neurology

## 2024-02-28 DIAGNOSIS — G40B09 Juvenile myoclonic epilepsy, not intractable, without status epilepticus: Secondary | ICD-10-CM

## 2024-02-28 NOTE — Progress Notes (Unsigned)
 EEG complete - results pending

## 2024-03-02 ENCOUNTER — Other Ambulatory Visit (HOSPITAL_COMMUNITY): Payer: Self-pay

## 2024-03-11 ENCOUNTER — Ambulatory Visit: Payer: Self-pay | Admitting: *Deleted

## 2024-03-11 NOTE — Telephone Encounter (Signed)
 Patient's mother on DPR and not with patient now.  Chief Complaint: patient mother calling to report she thinks medication for seizure might be causing patient thoughts of hurting self.  Symptoms: last few days reports "doesn't want to be here anymore". Gets upset easily "over nothing". Her puppies barking yesterday and "set her off". Patient at her home now with 19 year old child and patient's brother who is asleep.  Frequency: last few days  Pertinent Negatives: Patient denies chest pain no difficulty breathing  Disposition: [x] ED /[] Urgent Care (no appt availability in office) / [] Appointment(In office/virtual)/ []  [facility]/ [] Home Care/ [] Refused Recommended Disposition /[] [facility]/ []  Follow-up with PCP Additional Notes:   Recommended patient's mother to contact pt brother to check on patient. If not able to wake up brother call 911 for wellness check and notify patient expressed hurting self. Unsure of what patient's mother will do. Pt mother reports she does not think patient will answer phone . Please advise.  CAL notified recommended to contact 911.       Copied from CRM [id number]. Topic: Clinical - Red Word Triage >> Mar 11, 2024  1:20 PM Elle L wrote: Red Word that prompted transfer to Nurse Triage: The patient's Vallery Sa [phone], states that the patient has been threatening to hurt herself. She is unsure if it is her seizure medication causing it. Reason for Disposition  Sounds like a life-threatening emergency to the triager  Answer Assessment - Initial Assessment Questions 1. CONCERN: "Did anything happen that prompted you to call today?"      Patient's mother not with patient now, reports patient expressing thoughts of hurting self and patient "doesn't want to be here anymore". 2. ANXIETY SYMPTOMS: "Can you describe how you (your loved one; patient) have been feeling?" (e.g., tense, restless, panicky, anxious, keyed up, overwhelmed, sense of  impending doom).      Reports patient hs acting different , easily upset, "flipping out over nothing'. 3. ONSET: "How long have you been feeling this way?" (e.g., hours, days, weeks)     Last few days  4. SEVERITY: "How would you rate the level of anxiety?" (e.g., 0 - 10; or mild, moderate, severe).     Na  5. FUNCTIONAL IMPAIRMENT: "How have these feelings affected your ability to do daily activities?" "Have you had more difficulty than usual doing your normal daily activities?" (e.g., getting better, same, worse; self-care, school, work, interactions)     Na  6. HISTORY: "Have you felt this way before?" "Have you ever been diagnosed with an anxiety problem in the past?" (e.g., generalized anxiety disorder, panic attacks, PTSD). If Yes, ask: "How was this problem treated?" (e.g., medicines, counseling, etc.)     Na  7. RISK OF HARM - SUICIDAL IDEATION: "Do you ever have thoughts of hurting or killing yourself?" If Yes, ask:  "Do you have these feelings now?" "Do you have a plan on how you would do this?"     Hurting self no plan reported  8. TREATMENT:  "What has been done so far to treat this anxiety?" (e.g., medicines, relaxation strategies). "What has helped?"     Medication for seizures 9. TREATMENT - THERAPIST: "Do you have a counselor or therapist? Name?"     Na  10. POTENTIAL TRIGGERS: "Do you drink caffeinated beverages (e.g., coffee, colas, teas), and how much daily?" "Do you drink alcohol or use any drugs?" "Have you started any new medicines recently?"  na 11. PATIENT SUPPORT: "Who is with you now?" "Who do you live with?" "Do you have family or friends who you can talk to?"        Patient's brother with patient now but asleep per patient mother  62. OTHER SYMPTOMS: "Do you have any other symptoms?" (e.g., feeling depressed, trouble concentrating, trouble sleeping, trouble breathing, palpitations or fast heartbeat, chest pain, sweating, nausea, or diarrhea)       Not eating well  staying up late, threatening to hurt self  13. PREGNANCY: "Is there any chance you are pregnant?" "When was your last menstrual period?"       na  Protocols used: Anxiety and Panic Attack-A-AH

## 2024-03-12 NOTE — Addendum Note (Signed)
 Addended by: Van Clines on: 03/12/2024 12:37 AM   Modules accepted: Orders

## 2024-03-12 NOTE — Procedures (Signed)
 ELECTROENCEPHALOGRAM REPORT  Date of Study: 02/28/2024  Patient's Name: Sheila Watson MRN: [id number] Date of Birth: 0/13/2007  Referring Provider: Dr. Patrcia Dolly  Clinical History: This is an 18 year old woman with a history of JME, seizure-free for 4 years until recent breakthrough seizure. EEG for classification.  Medications: Zonisamide  Technical Summary: A multichannel digital 1-hour EEG recording measured by the international 10-20 system with electrodes applied with paste and impedances below 5000 ohms performed in our laboratory with EKG monitoring in an awake and asleep patient.  Hyperventilation and photic stimulation were performed.  The digital EEG was referentially recorded, reformatted, and digitally filtered in a variety of bipolar and referential montages for optimal display.    Description: The patient is awake and asleep during the recording.  During maximal wakefulness, there is a symmetric, medium voltage 10 Hz posterior dominant rhythm that attenuates with eye opening.  The record is symmetric.  During drowsiness and sleep, there is an increase in theta slowing of the background.  Vertex waves and symmetric sleep spindles were seen. Hyperventilation and photic stimulation did not elicit any abnormalities.  There were no epileptiform discharges or electrographic seizures seen.    EKG lead was unremarkable.  Impression: This 1-hour awake and asleep EEG is normal.    Clinical Correlation: A normal EEG does not exclude a clinical diagnosis of epilepsy.  If further clinical questions remain, prolonged EEG may be helpful.  Clinical correlation is advised.   Patrcia Dolly, M.D.

## 2024-03-12 NOTE — Telephone Encounter (Signed)
 Can you call the patient and try to get her in to see me in the next 1 to 2 days.

## 2024-03-14 ENCOUNTER — Ambulatory Visit: Admitting: Family Medicine

## 2024-03-14 NOTE — Progress Notes (Deleted)
   Established Patient Office Visit  Subjective   Patient ID: Sheila Watson, female    DOB: [birth year]  Age: 18 y.o. MRN: [id number]  No chief complaint on file.   HPI  Mood - worsening. Depression.  No bupropion.  Consider ssri    The ASCVD Risk score (Arnett DK, et al., 2019) failed to calculate for the following reasons:   The 2019 ASCVD risk score is only valid for ages 65 to 77  Health Maintenance Due  Topic Date Due   COVID-19 Vaccine (3 - Pfizer risk series) 07/14/2020      Objective:     There were no vitals taken for this visit. {Vitals History (Optional):23777}  Physical Exam   No results found for any visits on 03/14/24.      Assessment & Plan:   There are no diagnoses linked to this encounter.   No follow-ups on file.    Sandre Kitty, MD

## 2024-03-29 ENCOUNTER — Ambulatory Visit: Payer: Commercial Managed Care - PPO | Admitting: Family Medicine

## 2024-03-29 ENCOUNTER — Telehealth: Payer: Self-pay | Admitting: *Deleted

## 2024-03-29 NOTE — Telephone Encounter (Signed)
 LVM for pt to call back so that we can reschedule her appointment that is scheduled on 05/10/24 due to the provider not being in the office that day.  Please assist in getting that rescheduled when she calls back.

## 2024-04-09 NOTE — Telephone Encounter (Signed)
 LVM to call office to reschedule her appointment that is scheduled for 05/10/24 due to provider not being in office that day. Please assist her in getting this rescheduled after the 23rd of May.

## 2024-05-10 ENCOUNTER — Encounter: Payer: Commercial Managed Care - PPO | Admitting: Family Medicine

## 2024-05-16 ENCOUNTER — Encounter: Admitting: Family Medicine

## 2024-06-04 ENCOUNTER — Telehealth (INDEPENDENT_AMBULATORY_CARE_PROVIDER_SITE_OTHER): Payer: Self-pay | Admitting: Neurology

## 2024-06-04 ENCOUNTER — Other Ambulatory Visit (HOSPITAL_COMMUNITY): Payer: Self-pay

## 2024-06-04 ENCOUNTER — Encounter: Payer: Self-pay | Admitting: Neurology

## 2024-06-04 VITALS — Ht 63.0 in | Wt 113.0 lb

## 2024-06-04 DIAGNOSIS — G40B09 Juvenile myoclonic epilepsy, not intractable, without status epilepticus: Secondary | ICD-10-CM

## 2024-06-04 MED ORDER — ZONISAMIDE 100 MG PO CAPS
300.0000 mg | ORAL_CAPSULE | Freq: Every day | ORAL | 3 refills | Status: AC
  Filled 2024-06-04 – 2024-06-27 (×2): qty 270, 90d supply, fill #0
  Filled 2024-10-21: qty 270, 90d supply, fill #1

## 2024-06-04 NOTE — Progress Notes (Signed)
 Virtual Visit via Video Note The purpose of this virtual visit is to provide medical care while limiting exposure to the novel coronavirus.    Consent was obtained for video visit:  Yes.   Answered questions that patient had about telehealth interaction:  Yes.   I discussed the limitations, risks, security and privacy concerns of performing an evaluation and management service by telemedicine. I also discussed with the patient that there may be a patient responsible charge related to this service. The patient expressed understanding and agreed to proceed.  Pt location: Private vehicle Physician Location: office Name of referring provider:  Laneta Pintos, MD I connected with Sheila Watson at patients initiation/request on 06/04/2024 at  1:30 PM EDT by video enabled telemedicine application and verified that I am speaking with the correct person using two identifiers. Pt MRN:  [id number] Pt DOB:  [birth year] Video Participants:  Sheila Watson   History of Present Illness:  The patient had a virtual video visit on 06/04/2024.  She was last seen in the neurology clinic 3 months ago for JME. Records and images were personally reviewed where available.  She had a seizure on 02/17/24 after being seizure-free for nearly 4 years. She reported stopping Zonisamide  and resumed medication after the seizure. She reported memory and speech difficulties after the seizure. EEG in 02/2024 was normal. She is on Zonisamide  300mg  at bedtime with no further seizures since 02/17/2024. The cognitive and speech changes have resolved. She denies any staring/unresponsive episodes, gaps in time, olfactory/gustatory hallucinations, focal numbness/tingling/weakness. When she stays up late, she notices twitching when she lays down to sleep. For the past month, she has been having difficulty with sleep initiation, going to bed at 3am, but still getting at least 8 hours of sleep. No headaches, vision changes, no falls. She gets  lightheaded when standing and hot flashes throughout the day. She wonders about anemia, she was previously getting iron  infusions. She has a PCP appointment in August. Mood is okay, she is irritable sometimes, which she attributes to sleep issues. She also wonders if Zonisamide  is contributing to sleep difficulty.   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 Records from her pediatric neurologist at [facility], Dr. Jerilynn Montenegro, were reviewed. Her first and only generalized convulsion was a nocturnal seizure [place] 04[place]20 [place]. She was noted to be unresponsive with gurgling noises, stiff extremities with jerking, foaming at the mouth, for 1-2 minutes. She was disoriented after with note of tongue bite. EEG in 11/2020 reported occasional bursts of sharply contoured high amplitude generalized slowing, photoparoxysmal responses that did not outlast IPS with a run of spike and slow waves maximal in the occipital leads O1>O2 where she did not remember a verbal prompt. MRI brain with and without contrast at [facility] in 12/2020 was normal.   She reports that prior to the convulsion, she was having episodes of myoclonus with quick upper body jerks when exposed to sunlight through the trees in car rides. When younger, her mother had noticed she would have brief staring. She denies any further myoclonus since starting seizure medications. She denies any unresponsive episodes, gaps in time, olfactory/gustatory hallucinations, rising epigastric sensation, focal numbness/tingling/weakness. She was initially on Keppra XR, then switched to Zonisamide  in 2022 due to becoming hateful on the Keppra. She has been tolerating the Zonisamide  300mg  qhs without side effects. She denies any headaches, dizziness,  diplopia, dysarthria, dysphagia, neck/back pain, bowel/bladder dysfunction. No anosmia,  tremors, no falls. She usually gets 8-10 hours of sleep. No alcohol. She is finishing high school online this year. Memory is pretty good. Mood is good, sometimes she gets irritable. She lives with her mother, brother, and 19 year old son [name]. She is driving.   Epilepsy Risk Factors:  Her maternal grandmother had one seizure in early adolescence, mother's cousin had multiple seizures as an adult, 2 maternal cousins have had (?febrile) seizures as infants and toddlers. She had a normal birth and early development.  There is no history of febrile convulsions, CNS infections such as meningitis/encephalitis, significant traumatic brain injury, neurosurgical procedures.  Prior AEDs: Keppra XR (mood changes)  Diagnostic Data: EEG in 11/2020 reported occasional bursts of sharply contoured high amplitude generalized slowing, photoparoxysmal responses that did not outlast IPS with a run of spike and slow waves maximal in the occipital leads O1>O2 where she did not remember a verbal prompt.   MRI brain with and without contrast at [facility] in 12/2020 was normal.     Current Outpatient Medications on File Prior to Visit  Medication Sig Dispense Refill   diazePAM , 15 MG Dose, (VALTOCO  15 MG DOSE) 2 x 7.5 MG/0.1ML LQPK Administer one spray in one nostril, second spray in other nostril as needed for seizure (one dose). May use second dose in 4 hours 10 each 5   zonisamide  (ZONEGRAN ) 100 MG capsule Take 3 capsules every night 270 capsule 3   norelgestromin -ethinyl estradiol  (XULANE) 150-35 MCG/24HR transdermal patch Place 1 patch onto the skin once a week. (Patient not taking: Reported on 06/04/2024) 9 patch 4   No current facility-administered medications on file prior to visit.     Observations/Objective:   Vitals:   06/04/24 1314  Weight: 113 lb (51.3 kg)  Height: 5' 3 (1.6 m)   GEN:  The patient appears stated age and is in NAD. Neurological examination: Patient is awake, alert. No aphasia or  dysarthria. Intact fluency and comprehension.Cranial nerves: Extraocular movements intact. No facial asymmetry. Motor: moves all extremities symmetrically, at least anti-gravity x 4.    Assessment and Plan:   This is a pleasant 18 yo RH woman with Juvenile Myoclonic Epilepsy. EEG in 2021 showed photoparoxysmal response. MRI brain normal. She had been seizure-free for almost 4 years until a nocturnal convulsion on 02/17/24 in the setting of medication compliance issues. Repeat EEG normal. She is doing well back on Zonisamide  300mg  at bedtime, seizure-free since 02/17/2024. She is reporting difficulty with sleep initiation and wonders if Zonisamide  is contributing, she was advised to try taking it in the morning and monitor response. She can also take melatonin as needed. Follow-up with PCP for lightheadedness and hot flashes. We again discussed avoidance of seizure triggers. She is aware of [place] driving laws and will call our office if seizure-free by 08/19/24 and DMV paperwork will be filled out as previously planned. She will have Zonisamide  level done prior to September. Follow-up in 6 months, call for any changes.    Follow Up Instructions:    -I discussed the assessment and treatment plan with the patient. The patient was provided an opportunity to ask questions and all were answered. The patient agreed with the plan and demonstrated an understanding of the instructions.   The patient was advised to call back or seek an in-person evaluation if the symptoms worsen or if the condition fails to improve as anticipated.    Jhonny Moss,  MD

## 2024-06-04 NOTE — Patient Instructions (Signed)
 Good to see you.  You can try taking the Zonisamide  in the morning and see if this helps with the sleep difficulty. You can also start taking melatonin as needed to also help with sleep.   Have Zonisamide  level done before September, call our office on September 1, if no seizures, we will fill out DMV form and send back.  Follow-up in 6 months, call for any changes.   Seizure Precautions: 1. If medication has been prescribed for you to prevent seizures, take it exactly as directed.  Do not stop taking the medicine without talking to your doctor first, even if you have not had a seizure in a long time.   2. Avoid activities in which a seizure would cause danger to yourself or to others.  Don't operate dangerous machinery, swim alone, or climb in high or dangerous places, such as on ladders, roofs, or girders.  Do not drive unless your doctor says you may.  3. If you have any warning that you may have a seizure, lay down in a safe place where you can't hurt yourself.    4.  No driving for 6 months from last seizure, as per [place]  state law.   Please refer to the following link on the Epilepsy Foundation of America's website for more information: http://www.epilepsyfoundation.org/answerplace/Social/driving/drivingu.cfm   5.  Maintain good sleep hygiene. Avoid alcohol  6.  Notify your neurology if you are planning pregnancy or if you become pregnant.  7.  Contact your doctor if you have any problems that may be related to the medicine you are taking.  8.  Call 911 and bring the patient back to the ED if:        A.  The seizure lasts longer than 5 minutes.       B.  The patient doesn't awaken shortly after the seizure  C.  The patient has new problems such as difficulty seeing, speaking or moving  D.  The patient was injured during the seizure  E.  The patient has a temperature over 102 F (39C)  F.  The patient vomited and now is having trouble breathing

## 2024-06-14 ENCOUNTER — Other Ambulatory Visit (HOSPITAL_COMMUNITY): Payer: Self-pay

## 2024-06-17 ENCOUNTER — Telehealth: Payer: Self-pay | Admitting: *Deleted

## 2024-06-17 NOTE — Telephone Encounter (Signed)
 Copied from CRM [id number]. Topic: Medical Record Request - Records Request >> Jun 17, 2024  4:27 PM Ivette P wrote: Reason for CRM: Mom Delon called in to get a copy of shot records for school, please advised if possible   Would like a call back when ready for pick [phone]

## 2024-06-17 NOTE — Telephone Encounter (Signed)
 Shot record printed and notified mom that they would be up front for her to pick up

## 2024-06-27 ENCOUNTER — Other Ambulatory Visit (HOSPITAL_COMMUNITY): Payer: Self-pay

## 2024-07-23 ENCOUNTER — Other Ambulatory Visit (HOSPITAL_BASED_OUTPATIENT_CLINIC_OR_DEPARTMENT_OTHER): Payer: Self-pay

## 2024-07-29 ENCOUNTER — Ambulatory Visit: Admitting: Family Medicine

## 2024-07-29 ENCOUNTER — Encounter: Payer: Self-pay | Admitting: Family Medicine

## 2024-08-27 ENCOUNTER — Encounter (HOSPITAL_BASED_OUTPATIENT_CLINIC_OR_DEPARTMENT_OTHER): Payer: Self-pay

## 2024-08-27 ENCOUNTER — Emergency Department (HOSPITAL_BASED_OUTPATIENT_CLINIC_OR_DEPARTMENT_OTHER): Admitting: Radiology

## 2024-08-27 ENCOUNTER — Other Ambulatory Visit: Payer: Self-pay

## 2024-08-27 ENCOUNTER — Emergency Department (HOSPITAL_BASED_OUTPATIENT_CLINIC_OR_DEPARTMENT_OTHER)
Admission: EM | Admit: 2024-08-27 | Discharge: 2024-08-27 | Disposition: A | Discharge status: Home / Self Care | Attending: Emergency Medicine | Admitting: Emergency Medicine

## 2024-08-27 DIAGNOSIS — M79645 Pain in left finger(s): Secondary | ICD-10-CM | POA: Diagnosis not present

## 2024-08-27 DIAGNOSIS — X58XXXA Exposure to other specified factors, initial encounter: Secondary | ICD-10-CM | POA: Diagnosis not present

## 2024-08-27 DIAGNOSIS — S6992XA Unspecified injury of left wrist, hand and finger(s), initial encounter: Secondary | ICD-10-CM | POA: Diagnosis present

## 2024-08-27 DIAGNOSIS — S60012A Contusion of left thumb without damage to nail, initial encounter: Secondary | ICD-10-CM | POA: Insufficient documentation
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
## 2024-08-27 NOTE — Discharge Instructions (Signed)
 X-ray did not show any fracture.  Clinical concern for scaphoid fracture.  You are given a splint.  Follow-up with hand specialist.  Return for any concerning symptoms. Take 1000 mg of Tylenol  every 6 hours.  Take 600 mg of ibuprofen  every 6 hours.
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
## 2024-08-29 ENCOUNTER — Ambulatory Visit
Admission: RE | Admit: 2024-08-29 | Discharge: 2024-08-29 | Disposition: A | Discharge status: Home / Self Care | Source: Ambulatory Visit | Attending: Family Medicine

## 2024-08-29 ENCOUNTER — Other Ambulatory Visit: Payer: Self-pay | Admitting: Family Medicine

## 2024-08-29 DIAGNOSIS — N63 Unspecified lump in unspecified breast: Secondary | ICD-10-CM

## 2024-08-30 ENCOUNTER — Inpatient Hospital Stay
Admission: RE | Admit: 2024-08-30 | Discharge: 2024-08-30 | Discharge status: Home / Self Care | Source: Ambulatory Visit | Attending: Family Medicine

## 2024-08-30 DIAGNOSIS — N63 Unspecified lump in unspecified breast: Secondary | ICD-10-CM

## 2024-08-30 HISTORY — PX: BREAST BIOPSY: SHX20

## 2024-09-02 ENCOUNTER — Other Ambulatory Visit: Payer: Self-pay | Admitting: Family Medicine

## 2024-09-02 DIAGNOSIS — D249 Benign neoplasm of unspecified breast: Secondary | ICD-10-CM

## 2024-09-02 LAB — SURGICAL PATHOLOGY

## 2024-09-13 ENCOUNTER — Other Ambulatory Visit: Payer: Self-pay | Admitting: General Surgery

## 2024-09-13 NOTE — Progress Notes (Signed)
 REFERRING PHYSICIAN:  Toribio Slain, MD  PROVIDER:  JINA CLAIR NEPHEW, MD  Care Team: Patient Care Team: Slain Toribio POUR, MD as PCP - General (Family Medicine) NEPHEW JINA CLAIR, MD as Consulting Provider (General Surgery)   MRN: [id number] DOB: 18-Sep-2006 DATE OF ENCOUNTER: 09/13/2024  Subjective   Chief Complaint: Fibroadenoma- Left breast     History of Present Illness: Sheila Watson is a 18 y.o. female who is seen today as an office consultation at the request of Dr. Slain for evaluation of Fibroadenoma- Left breast .    Patient presents with a left breast mass that has been present for a little over a year.  It has been getting larger.  The patient underwent a core needle biopsy that was positive for fibroadenoma.  It originally was around 1.6 cm in greatest dimension and 6 months later was up to 2.1 cm in greatest dimension.  Patient states that it is not painful and she cannot manually appreciate it getting larger.  She has not noticed a difference when she menstruates.  She does desire to have it removed.    Family cancer history - negative Work - studying ultrasound at [employer]  Diagnostic ultrasound:08/29/2024 [facility] EXAM: ULTRASOUND OF THE LEFT BREAST  COMPARISON:  02/21/2024. FINDINGS: On correlative physical exam, there is a freely mobile palpable approximate 2 cm mass in the upper inner LEFT breast.  Targeted ultrasound is performed, confirming that the superficial oval circumscribed parallel hypoechoic mass at 11 o'clock 4 cm from the nipple has increased in size, currently measuring approximately 2.1 x 1.0 x 2.0 cm (previously 1.6 x 0.9 x 1.3 cm on 02/21/2024), demonstrating posterior acoustic enhancement and demonstrating internal power Doppler flow.  Sonographic evaluation of the axilla demonstrates no pathologic lymphadenopathy.  IMPRESSION: 1. Interval significant increase in size of the superficial mass in the UPPER INNER QUADRANT of the LEFT  breast at 11 o'clock 4 cm from the nipple. Measurements are given above. 2. No pathologic LEFT axillary lymphadenopathy.  RECOMMENDATION: The patient states that she wishes to have the mass surgically excised. Prior to surgical excision, ultrasound-guided core needle biopsy is recommended.  I have discussed the findings and recommendations with the patient. The ultrasound core needle biopsy procedure was discussed with the patient and her questions were answered. She wishes to proceed with the biopsy which has been scheduled by [facility] staff.  BI-RADS CATEGORY  4: Suspicious.  (4a.  Low suspicion for malignancy)    Pathology core needle biopsy: 08/30/24 1. Breast, left, needle core biopsy, 11 o'clock, 4cmfn, heart :       FIBROADENOMA.       NEGATIVE FOR MALIGNANCY.   Review of Systems: A complete review of systems was obtained from the patient.  I have reviewed this information and discussed as appropriate with the patient.  See HPI as well for other ROS. ROS -otherwise negative.    Medical History: Past Medical History:  Diagnosis Date  . Anemia   . Asthma, unspecified asthma severity, unspecified whether complicated, unspecified whether persistent (HHS-HCC)   . Seizure disorder (CMS/HHS-HCC)     Patient Active Problem List  Diagnosis  . Mass of upper inner quadrant of left breast  . Fibroadenoma of breast, left    Past Surgical History:  Procedure Laterality Date  . Dental Surgery  2010     Allergies  Allergen Reactions  . Sulfa (Sulfonamide Antibiotics) Rash  . Sulfasalazine Rash    Current Outpatient Medications  on File Prior to Visit  Medication Sig Dispense Refill  . zonisamide  (ZONEGRAN ) 100 MG capsule Take 300 mg by mouth     No current facility-administered medications on file prior to visit.    Family History  Problem Relation Age of Onset  . Seizures Maternal Grandmother   . Coronary Artery Disease (Blocked  arteries around heart) Maternal Grandfather      Social History   Tobacco Use  Smoking Status Never  Smokeless Tobacco Never     Social History   Socioeconomic History  . Marital status: Single  Tobacco Use  . Smoking status: Never  . Smokeless tobacco: Never  Substance and Sexual Activity  . Drug use: Never   Social Drivers of Health    Received from [employer]   Social Network    Objective:    Vitals:   09/13/24 0904  BP: 102/62  Pulse: 111  Temp: 36.9 C (98.4 F)  SpO2: 99%  Weight: 49.3 kg (108 lb 9.6 oz)  Height: 160 cm (5' 3)  PainSc: 0-No pain    Body mass index is 19.24 kg/m.   Gen:  No acute distress.  Well nourished and well groomed.   Neurological: Alert and oriented to person, place, and time. Coordination normal.  Head: Normocephalic and atraumatic.  Eyes: Conjunctivae are normal. Pupils are equal, round, and reactive to light. No scleral icterus.  Neck: Normal range of motion. Neck supple. No tracheal deviation or thyromegaly present.  Cardiovascular: Normal rate, regular rhythm, normal heart sounds and intact distal pulses.  Exam reveals no gallop and no friction rub.  No murmur heard. Breast: 1.5 to 2 cm mobile mass in the upper inner quadrant of the left breast.  This is around 1 cm away from the areolar border. Respiratory: Effort normal.  No respiratory distress. No chest wall tenderness. Breath sounds normal.  No wheezes, rales or rhonchi.  GI: Soft. Bowel sounds are normal. The abdomen is soft and nontender.  There is no rebound and no guarding.  Musculoskeletal: Normal range of motion. Extremities are nontender.  Lymphadenopathy: No cervical, preauricular, postauricular or axillary adenopathy is present Skin: Skin is warm and dry. No rash noted. No diaphoresis. No erythema. No pallor. No clubbing, cyanosis, or edema.   Psychiatric: Normal mood and affect. Behavior is normal. Judgment and thought content normal.    Labs None  available  Assessment and Plan:     ICD-10-CM   1. Mass of upper inner quadrant of left breast  N63.22     2. Fibroadenoma of breast, left  D24.2      Patient has a biopsy-proven fibroadenoma in the left breast.  This is been getting larger and patient desires removal.  I discussed this with the patient and her mother.  I reviewed surgery and that she would undergo general anesthesia.  I reviewed the procedure and the incision location.  I do think we can do a circumareolar incision.  Discussed that the patient would have a 1 week lifting restriction.  I reviewed risk of bleeding, infection, seroma, possible heart or lung complications, blood clot.  I reviewed that sometimes patients have issues with concentration and that it would be advantageous to do this on a break from school.  We will contact her and set this up.  Patient and mother are agreeable with the plan.  We do not need a radiologic marker as this is easily palpable.  No follow-ups on file.   Jina LITTIE Nephew, MD FACS  Surgical Oncology, General Surgery, Trauma and Critical Care [facility]

## 2024-09-16 ENCOUNTER — Other Ambulatory Visit: Payer: Self-pay

## 2024-09-16 ENCOUNTER — Encounter (HOSPITAL_BASED_OUTPATIENT_CLINIC_OR_DEPARTMENT_OTHER): Payer: Self-pay | Admitting: General Surgery

## 2024-09-23 ENCOUNTER — Ambulatory Visit (HOSPITAL_BASED_OUTPATIENT_CLINIC_OR_DEPARTMENT_OTHER): Admitting: Anesthesiology

## 2024-09-23 ENCOUNTER — Other Ambulatory Visit: Payer: Self-pay

## 2024-09-23 ENCOUNTER — Encounter (HOSPITAL_BASED_OUTPATIENT_CLINIC_OR_DEPARTMENT_OTHER)
Admission: RE | Disposition: A | Discharge status: Home / Self Care | Payer: Self-pay | Source: Home / Self Care | Attending: General Surgery

## 2024-09-23 ENCOUNTER — Encounter (HOSPITAL_BASED_OUTPATIENT_CLINIC_OR_DEPARTMENT_OTHER): Payer: Self-pay | Admitting: General Surgery

## 2024-09-23 ENCOUNTER — Ambulatory Visit (HOSPITAL_BASED_OUTPATIENT_CLINIC_OR_DEPARTMENT_OTHER)
Admission: RE | Admit: 2024-09-23 | Discharge: 2024-09-23 | Disposition: A | Discharge status: Home / Self Care | Attending: General Surgery | Admitting: General Surgery

## 2024-09-23 DIAGNOSIS — D242 Benign neoplasm of left breast: Secondary | ICD-10-CM | POA: Insufficient documentation

## 2024-09-23 DIAGNOSIS — N6320 Unspecified lump in the left breast, unspecified quadrant: Secondary | ICD-10-CM | POA: Diagnosis not present

## 2024-09-23 DIAGNOSIS — G40909 Epilepsy, unspecified, not intractable, without status epilepticus: Secondary | ICD-10-CM | POA: Diagnosis not present

## 2024-09-23 HISTORY — DX: Depression, unspecified: F32.A

## 2024-09-23 HISTORY — DX: Unspecified asthma, uncomplicated: J45.909

## 2024-09-23 HISTORY — DX: Unspecified lump in unspecified breast: N63.0

## 2024-09-23 LAB — POCT PREGNANCY, URINE: Preg Test, Ur: NEGATIVE

## 2024-09-23 SURGERY — EXCISION, MASS, BREAST
Anesthesia: General | Site: Breast | Laterality: Left

## 2024-09-23 MED ORDER — ACETAMINOPHEN 10 MG/ML IV SOLN
INTRAVENOUS | Status: AC
  Filled 2024-09-23: qty 100

## 2024-09-23 MED ORDER — CEFAZOLIN SODIUM-DEXTROSE 2-4 GM/100ML-% IV SOLN
INTRAVENOUS | Status: AC
Start: 2024-09-23 — End: 2024-09-23
  Filled 2024-09-23: qty 100

## 2024-09-23 MED ORDER — LIDOCAINE-EPINEPHRINE (PF) 1 %-1:200000 IJ SOLN
INTRAMUSCULAR | Status: DC | PRN
  Administered 2024-09-23: 18.2 mL

## 2024-09-23 MED ORDER — DEXAMETHASONE SODIUM PHOSPHATE 10 MG/ML IJ SOLN
INTRAMUSCULAR | Status: AC
  Filled 2024-09-23: qty 1

## 2024-09-23 MED ORDER — PROPOFOL 500 MG/50ML IV EMUL
INTRAVENOUS | Status: DC | PRN
  Administered 2024-09-23: 125 ug/kg/min via INTRAVENOUS

## 2024-09-23 MED ORDER — EPHEDRINE 5 MG/ML INJ
INTRAVENOUS | Status: AC
  Filled 2024-09-23: qty 5

## 2024-09-23 MED ORDER — KETOROLAC TROMETHAMINE 30 MG/ML IJ SOLN
INTRAMUSCULAR | Status: DC | PRN
  Administered 2024-09-23: 30 mg via INTRAVENOUS

## 2024-09-23 MED ORDER — ONDANSETRON HCL 4 MG/2ML IJ SOLN
INTRAMUSCULAR | Status: AC
Start: 2024-09-23 — End: 2024-09-23
  Filled 2024-09-23: qty 2

## 2024-09-23 MED ORDER — DROPERIDOL 2.5 MG/ML IJ SOLN: 0.6250 mg | Freq: Once | INTRAMUSCULAR | Status: DC | PRN

## 2024-09-23 MED ORDER — CEFAZOLIN SODIUM-DEXTROSE 2-4 GM/100ML-% IV SOLN
2.0000 g | INTRAVENOUS | Status: AC
  Administered 2024-09-23: 2 g via INTRAVENOUS

## 2024-09-23 MED ORDER — CHLORHEXIDINE GLUCONATE CLOTH 2 % EX PADS: 6.0000 | MEDICATED_PAD | Freq: Once | CUTANEOUS | Status: DC

## 2024-09-23 MED ORDER — SCOPOLAMINE 1 MG/3DAYS TD PT72
1.0000 | MEDICATED_PATCH | TRANSDERMAL | Status: DC
  Administered 2024-09-23: 1 mg via TRANSDERMAL

## 2024-09-23 MED ORDER — FENTANYL CITRATE (PF) 100 MCG/2ML IJ SOLN: 25.0000 ug | INTRAMUSCULAR | Status: DC | PRN

## 2024-09-23 MED ORDER — BUPIVACAINE HCL (PF) 0.25 % IJ SOLN
INTRAMUSCULAR | Status: DC | PRN
  Administered 2024-09-23: 18.2 mL

## 2024-09-23 MED ORDER — PHENYLEPHRINE 80 MCG/ML (10ML) SYRINGE FOR IV PUSH (FOR BLOOD PRESSURE SUPPORT)
PREFILLED_SYRINGE | INTRAVENOUS | Status: AC
Start: 2024-09-23 — End: 2024-09-23
  Filled 2024-09-23: qty 10

## 2024-09-23 MED ORDER — GLYCOPYRROLATE PF 0.2 MG/ML IJ SOSY
PREFILLED_SYRINGE | INTRAMUSCULAR | Status: AC
  Filled 2024-09-23: qty 1

## 2024-09-23 MED ORDER — MIDAZOLAM HCL 2 MG/2ML IJ SOLN
INTRAMUSCULAR | Status: AC
  Filled 2024-09-23: qty 2

## 2024-09-23 MED ORDER — KETOROLAC TROMETHAMINE 30 MG/ML IJ SOLN
INTRAMUSCULAR | Status: AC
  Filled 2024-09-23: qty 1

## 2024-09-23 MED ORDER — GLYCOPYRROLATE PF 0.2 MG/ML IJ SOSY
PREFILLED_SYRINGE | INTRAMUSCULAR | Status: DC | PRN
  Administered 2024-09-23: .2 mg via INTRAVENOUS

## 2024-09-23 MED ORDER — DEXMEDETOMIDINE HCL IN NACL 80 MCG/20ML IV SOLN
INTRAVENOUS | Status: DC | PRN
  Administered 2024-09-23: 4 ug via INTRAVENOUS
  Administered 2024-09-23: 8 ug via INTRAVENOUS

## 2024-09-23 MED ORDER — OXYCODONE HCL 5 MG/5ML PO SOLN: 5.0000 mg | Freq: Once | ORAL | Status: DC | PRN

## 2024-09-23 MED ORDER — FENTANYL CITRATE (PF) 100 MCG/2ML IJ SOLN
INTRAMUSCULAR | Status: DC | PRN
  Administered 2024-09-23 (×2): 25 ug via INTRAVENOUS
  Administered 2024-09-23: 50 ug via INTRAVENOUS

## 2024-09-23 MED ORDER — EPHEDRINE SULFATE-NACL 50-0.9 MG/10ML-% IV SOSY
PREFILLED_SYRINGE | INTRAVENOUS | Status: DC | PRN
  Administered 2024-09-23 (×2): 5 mg via INTRAVENOUS

## 2024-09-23 MED ORDER — MIDAZOLAM HCL 5 MG/5ML IJ SOLN
INTRAMUSCULAR | Status: DC | PRN
  Administered 2024-09-23: 2 mg via INTRAVENOUS

## 2024-09-23 MED ORDER — DEXAMETHASONE SODIUM PHOSPHATE 10 MG/ML IJ SOLN
INTRAMUSCULAR | Status: DC | PRN
  Administered 2024-09-23: 4 mg via INTRAVENOUS

## 2024-09-23 MED ORDER — LIDOCAINE 2% (20 MG/ML) 5 ML SYRINGE
INTRAMUSCULAR | Status: AC
  Filled 2024-09-23: qty 5

## 2024-09-23 MED ORDER — SCOPOLAMINE 1 MG/3DAYS TD PT72
MEDICATED_PATCH | TRANSDERMAL | Status: AC
  Filled 2024-09-23: qty 1

## 2024-09-23 MED ORDER — ACETAMINOPHEN 10 MG/ML IV SOLN: 1000.0000 mg | Freq: Once | INTRAVENOUS | Status: DC | PRN

## 2024-09-23 MED ORDER — LIDOCAINE 2% (20 MG/ML) 5 ML SYRINGE
INTRAMUSCULAR | Status: DC | PRN
  Administered 2024-09-23: 40 mg via INTRAVENOUS

## 2024-09-23 MED ORDER — ACETAMINOPHEN 500 MG PO TABS
ORAL_TABLET | ORAL | Status: AC
Start: 2024-09-23 — End: 2024-09-23
  Filled 2024-09-23: qty 2

## 2024-09-23 MED ORDER — PROPOFOL 10 MG/ML IV BOLUS
INTRAVENOUS | Status: DC | PRN
Start: 2024-09-23 — End: 2024-09-23
  Administered 2024-09-23: 160 mg via INTRAVENOUS

## 2024-09-23 MED ORDER — ACETAMINOPHEN 500 MG PO TABS: 1000.0000 mg | ORAL_TABLET | Freq: Once | ORAL | Status: AC

## 2024-09-23 MED ORDER — LIDOCAINE-EPINEPHRINE (PF) 1 %-1:200000 IJ SOLN
INTRAMUSCULAR | Status: AC
  Filled 2024-09-23: qty 30

## 2024-09-23 MED ORDER — LACTATED RINGERS IV SOLN: INTRAVENOUS | Status: DC

## 2024-09-23 MED ORDER — GLYCOPYRROLATE PF 0.2 MG/ML IJ SOSY
PREFILLED_SYRINGE | INTRAMUSCULAR | Status: AC
Start: 2024-09-23 — End: 2024-09-23
  Filled 2024-09-23: qty 1

## 2024-09-23 MED ORDER — ONDANSETRON HCL 4 MG/2ML IJ SOLN
INTRAMUSCULAR | Status: DC | PRN
  Administered 2024-09-23: 4 mg via INTRAVENOUS

## 2024-09-23 MED ORDER — ACETAMINOPHEN 500 MG PO TABS
1000.0000 mg | ORAL_TABLET | ORAL | Status: AC
  Administered 2024-09-23: 1000 mg via ORAL

## 2024-09-23 MED ORDER — OXYCODONE HCL 5 MG PO TABS: 5.0000 mg | ORAL_TABLET | Freq: Four times a day (QID) | ORAL | 0 refills | Status: DC | PRN

## 2024-09-23 MED ORDER — OXYCODONE HCL 5 MG PO TABS: 5.0000 mg | ORAL_TABLET | Freq: Once | ORAL | Status: DC | PRN

## 2024-09-23 MED ORDER — FENTANYL CITRATE (PF) 100 MCG/2ML IJ SOLN
INTRAMUSCULAR | Status: AC
  Filled 2024-09-23: qty 2

## 2024-09-23 SURGICAL SUPPLY — 42 items
BINDER BREAST LRG (GAUZE/BANDAGES/DRESSINGS) IMPLANT
BINDER BREAST MEDIUM (GAUZE/BANDAGES/DRESSINGS) IMPLANT
BINDER BREAST XLRG (GAUZE/BANDAGES/DRESSINGS) IMPLANT
BINDER BREAST XXLRG (GAUZE/BANDAGES/DRESSINGS) IMPLANT
BLADE SURG 10 STRL SS (BLADE) ×4 IMPLANT
CANISTER SUCT 1200ML W/VALVE (MISCELLANEOUS) ×2 IMPLANT
CHLORAPREP W/TINT 26 (MISCELLANEOUS) ×2 IMPLANT
CLIP TI LARGE 6 (CLIP) IMPLANT
COVER BACK TABLE 60X90IN (DRAPES) ×2 IMPLANT
COVER MAYO STAND STRL (DRAPES) ×2 IMPLANT
DERMABOND ADVANCED .7 DNX12 (GAUZE/BANDAGES/DRESSINGS) ×2 IMPLANT
DRAPE LAPAROSCOPIC ABDOMINAL (DRAPES) ×2 IMPLANT
DRAPE UTILITY XL STRL (DRAPES) ×2 IMPLANT
ELECTRODE REM PT RTRN 9FT ADLT (ELECTROSURGICAL) ×2 IMPLANT
GAUZE SPONGE 4X4 12PLY STRL LF (GAUZE/BANDAGES/DRESSINGS) ×2 IMPLANT
GLOVE BIO SURGEON STRL SZ 6 (GLOVE) ×2 IMPLANT
GLOVE BIOGEL PI IND STRL 6.5 (GLOVE) ×2 IMPLANT
GOWN STRL REUS W/ TWL LRG LVL3 (GOWN DISPOSABLE) ×2 IMPLANT
GOWN STRL REUS W/ TWL XL LVL3 (GOWN DISPOSABLE) ×2 IMPLANT
KIT MARKER MARGIN INK (KITS) IMPLANT
LIGHT WAVEGUIDE WIDE FLAT (MISCELLANEOUS) IMPLANT
NDL HYPO 25X1 1.5 SAFETY (NEEDLE) ×2 IMPLANT
NEEDLE HYPO 25X1 1.5 SAFETY (NEEDLE) ×1 IMPLANT
NS IRRIG 1000ML POUR BTL (IV SOLUTION) ×2 IMPLANT
PACK BASIN DAY SURGERY FS (CUSTOM PROCEDURE TRAY) ×2 IMPLANT
PENCIL SMOKE EVACUATOR (MISCELLANEOUS) ×2 IMPLANT
SLEEVE SCD COMPRESS KNEE MED (STOCKING) ×2 IMPLANT
SPIKE FLUID TRANSFER (MISCELLANEOUS) IMPLANT
SPONGE T-LAP 18X18 ~~LOC~~+RFID (SPONGE) ×2 IMPLANT
STAPLER SKIN PROX WIDE 3.9 (STAPLE) IMPLANT
STRIP CLOSURE SKIN 1/2X4 (GAUZE/BANDAGES/DRESSINGS) ×2 IMPLANT
SUT MON AB 4-0 PC3 18 (SUTURE) ×2 IMPLANT
SUT SILK 2 0 SH (SUTURE) IMPLANT
SUT VIC AB 2-0 SH 27XBRD (SUTURE) IMPLANT
SUT VIC AB 3-0 54X BRD REEL (SUTURE) IMPLANT
SUT VICRYL 3-0 CR8 SH (SUTURE) ×2 IMPLANT
SYR BULB EAR ULCER 3OZ GRN STR (SYRINGE) ×2 IMPLANT
SYR CONTROL 10ML LL (SYRINGE) ×2 IMPLANT
TOWEL GREEN STERILE FF (TOWEL DISPOSABLE) ×2 IMPLANT
TRAY FAXITRON CT DISP (TRAY / TRAY PROCEDURE) IMPLANT
TUBE CONNECTING 20X1/4 (TUBING) ×2 IMPLANT
YANKAUER SUCT BULB TIP NO VENT (SUCTIONS) ×2 IMPLANT

## 2024-09-23 NOTE — Anesthesia Procedure Notes (Addendum)
 Procedure Name: LMA Insertion Date/Time: 09/23/2024 1:56 PM  Performed by: Denton Niels CROME, CRNAPre-anesthesia Checklist: Patient identified, Emergency Drugs available, Suction available, Patient being monitored and Timeout performed Patient Re-evaluated:Patient Re-evaluated prior to induction Oxygen Delivery Method: Circle system utilized Preoxygenation: Pre-oxygenation with 100% oxygen Induction Type: IV induction Ventilation: Mask ventilation without difficulty LMA: LMA with gastric port inserted LMA Size: 3.0 Number of attempts: 1 Placement Confirmation: positive ETCO2 Dental Injury: Teeth and Oropharynx as per pre-operative assessment

## 2024-09-23 NOTE — H&P (Signed)
 REFERRING PHYSICIAN: Toribio Slain, MD  PROVIDER: JINA CLAIR NEPHEW, MD  Care Team: Patient Care Team: Slain Toribio POUR, MD as PCP - General (Family Medicine) NEPHEW JINA CLAIR, MD as Consulting Provider (General Surgery)   MRN: [id number] DOB: 10/05/2006 DATE OF ENCOUNTER: 09/13/2024  Subjective   Chief Complaint: Fibroadenoma- Left breast   History of Present Illness: Sheila Watson is a 18 y.o. female who is seen today as an office consultation at the request of Dr. Slain for evaluation of Fibroadenoma- Left breast .   Patient presents with a left breast mass that has been present for a little over a year. It has been getting larger. The patient underwent a core needle biopsy that was positive for fibroadenoma. It originally was around 1.6 cm in greatest dimension and 6 months later was up to 2.1 cm in greatest dimension. Patient states that it is not painful and she cannot manually appreciate it getting larger. She has not noticed a difference when she menstruates. She does desire to have it removed.  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 FINDINGS: On correlative physical exam, there is a freely mobile palpable approximate 2 cm mass in the upper inner LEFT breast. Targeted ultrasound is performed, confirming that the superficial oval circumscribed parallel hypoechoic mass at 11 o'clock 4 cm from the nipple has increased in size, currently measuring approximately 2.1 x 1.0 x 2.0 cm (previously 1.6 x 0.9 x 1.3 cm on 02/21/2024), demonstrating posterior acoustic enhancement and demonstrating internal power Doppler flow. Sonographic evaluation of the axilla demonstrates no pathologic lymphadenopathy. IMPRESSION: 1. Interval significant increase in size of the superficial mass in the UPPER INNER QUADRANT of the LEFT breast at 11 o'clock 4 cm  from the nipple. Measurements are given above. 2. No pathologic LEFT axillary lymphadenopathy. RECOMMENDATION: The patient states that she wishes to have the mass surgically excised. Prior to surgical excision, ultrasound-guided core needle biopsy is recommended. I have discussed the findings and recommendations with the patient. The ultrasound core needle biopsy procedure was discussed with the patient and her questions were answered. She wishes to proceed with the biopsy which has been scheduled by [facility] staff. BI-RADS CATEGORY 4: Suspicious. (4a. Low suspicion for malignancy)   Pathology core needle biopsy: 08/30/24 1. Breast, left, needle core biopsy, 11 o'clock, 4cmfn, heart :  FIBROADENOMA.  NEGATIVE FOR MALIGNANCY.   Review of Systems: A complete review of systems was obtained from the patient. I have reviewed this information and discussed as appropriate with the patient. See HPI as well for other ROS. ROS -otherwise negative.  Medical History: Past Medical History:  Diagnosis Date  Anemia  Asthma, unspecified asthma severity, unspecified whether complicated, unspecified whether persistent (HHS-HCC)  Seizure disorder (CMS/HHS-HCC)   Patient Active Problem List  Diagnosis  Mass of upper inner quadrant of left breast  Fibroadenoma of breast, left   Past Surgical History:  Procedure Laterality Date  Dental Surgery 2010    Allergies  Allergen Reactions  Sulfa (Sulfonamide Antibiotics) Rash  Sulfasalazine Rash   Current Outpatient Medications on File Prior to Visit  Medication Sig Dispense Refill  zonisamide  (ZONEGRAN ) 100 MG capsule Take 300 mg by mouth   No current facility-administered medications on file prior to visit.   Family History  Problem Relation Age of Onset  Seizures Maternal Grandmother  Coronary Artery Disease (Blocked arteries around heart) Maternal Grandfather  Social History   Tobacco Use  Smoking  Status Never  Smokeless Tobacco Never    Social History   Socioeconomic History  Marital status: Single  Tobacco Use  Smoking status: Never  Smokeless tobacco: Never  Substance and Sexual Activity  Drug use: Never   Social Drivers of Health   Received from [employer]  Social Network   Objective:   Vitals:  09/13/24 0904  BP: 102/62  Pulse: 111  Temp: 36.9 C (98.4 F)  SpO2: 99%  Weight: 49.3 kg (108 lb 9.6 oz)  Height: 160 cm (5' 3)  PainSc: 0-No pain   Body mass index is 19.24 kg/m.  Gen: No acute distress. Well nourished and well groomed.  Neurological: Alert and oriented to person, place, and time. Coordination normal.  Head: Normocephalic and atraumatic.  Eyes: Conjunctivae are normal. Pupils are equal, round, and reactive to light. No scleral icterus.  Neck: Normal range of motion. Neck supple. No tracheal deviation or thyromegaly present.  Cardiovascular: Normal rate, regular rhythm, normal heart sounds and intact distal pulses. Exam reveals no gallop and no friction rub. No murmur heard. Breast: 1.5 to 2 cm mobile mass in the upper inner quadrant of the left breast. This is around 1 cm away from the areolar border. Respiratory: Effort normal. No respiratory distress. No chest wall tenderness. Breath sounds normal. No wheezes, rales or rhonchi.  GI: Soft. Bowel sounds are normal. The abdomen is soft and nontender. There is no rebound and no guarding.  Musculoskeletal: Normal range of motion. Extremities are nontender.  Lymphadenopathy: No cervical, preauricular, postauricular or axillary adenopathy is present Skin: Skin is warm and dry. No rash noted. No diaphoresis. No erythema. No pallor. No clubbing, cyanosis, or edema.  Psychiatric: Normal mood and affect. Behavior is normal. Judgment and thought content normal.   Labs None available  Assessment and Plan:   ICD-10-CM  1. Mass of upper inner quadrant of left breast N63.22   2. Fibroadenoma of  breast, left D24.2    Patient has a biopsy-proven fibroadenoma in the left breast. This is been getting larger and patient desires removal. I discussed this with the patient and her mother. I reviewed surgery and that she would undergo general anesthesia. I reviewed the procedure and the incision location. I do think we can do a circumareolar incision. Discussed that the patient would have a 1 week lifting restriction. I reviewed risk of bleeding, infection, seroma, possible heart or lung complications, blood clot. I reviewed that sometimes patients have issues with concentration and that it would be advantageous to do this on a break from school. We will contact her and set this up. Patient and mother are agreeable with the plan. We do not need a radiologic marker as this is easily palpable.

## 2024-09-23 NOTE — Interval H&P Note (Signed)
 History and Physical Interval Note:  09/23/2024 1:34 PM  Sheila Watson  has presented today for surgery, with the diagnosis of LEFT BREAST MASS.  The various methods of treatment have been discussed with the patient and family. After consideration of risks, benefits and other options for treatment, the patient has consented to  Procedure(s): Excision of left breast mass (Left) as a surgical intervention.  The patient's history has been reviewed, patient examined, no change in status, stable for surgery.  I have reviewed the patient's chart and labs.  Questions were answered to the patient's satisfaction.     [name]

## 2024-09-23 NOTE — Discharge Instructions (Addendum)
 [facility] Office Phone Number [phone]  BREAST BIOPSY/ PARTIAL MASTECTOMY: POST OP INSTRUCTIONS  Always review your discharge instruction sheet given to you by the facility where your surgery was performed.  IF YOU HAVE DISABILITY OR FAMILY LEAVE FORMS, YOU MUST BRING THEM TO THE OFFICE FOR PROCESSING.  DO NOT GIVE THEM TO YOUR DOCTOR.  Take 2 tylenol  (acetominophen) three times a day for 3 days.  If you still have pain, add ibuprofen  with food in between if able to take this (if you have kidney issues or stomach issues, do not take ibuprofen ).  If both of those are not enough, add the narcotic pain pill.  If you find you are needing a lot of this overnight after surgery, call the next morning for a refill.    Prescriptions will not be filled after 5pm or on week-ends. Take your usually prescribed medications unless otherwise directed You should eat very light the first 24 hours after surgery, such as soup, crackers, pudding, etc.  Resume your normal diet the day after surgery. Most patients will experience some swelling and bruising in the breast.  Ice packs and a good support bra will help.  Swelling and bruising can take several days to resolve.  It is common to experience some constipation if taking pain medication after surgery.  Increasing fluid intake and taking a stool softener will usually help or prevent this problem from occurring.  A mild laxative (Milk of Magnesia or Miralax) should be taken according to package directions if there are no bowel movements after 48 hours. Unless discharge instructions indicate otherwise, you may remove your bandages 48 hours after surgery, and you may shower at that time.  You may have steri-strips (small skin tapes) in place directly over the incision.  These strips should be left on the skin at least for for 7-10 days.    ACTIVITIES:  You may resume regular daily activities (gradually increasing) beginning the next day.  Wearing a  good support bra or sports bra (or the breast binder) minimizes pain and swelling.  You may have sexual intercourse when it is comfortable. No heavy lifting for 1-2 weeks (not over around 10 pounds).  You may drive when you no longer are taking prescription pain medication, you can comfortably wear a seatbelt, and you can safely maneuver your car and apply brakes. RETURN TO WORK:  __________3-14 days depending on job. _______________ Sheila Watson should see your doctor in the office for a follow-up appointment approximately two weeks after your surgery.  Your doctor's nurse will typically make your follow-up appointment when she calls you with your pathology report.  Expect your pathology report 3-4 business days after your surgery.  You may call to check if you do not hear from us  after three days.   WHEN TO CALL YOUR DOCTOR: Fever over 101.0 Nausea and/or vomiting. Extreme swelling or bruising. Continued bleeding from incision. Increased pain, redness, or drainage from the incision.  The clinic staff is available to answer your questions during regular business hours.  Please don't hesitate to call and ask to speak to one of the nurses for clinical concerns.  If you have a medical emergency, go to the nearest emergency room or call 911.  A surgeon from [facility] is always on call at the hospital.  For further questions, please visit [email or web address]   Post Anesthesia Home Care Instructions  Activity: Get plenty of rest for the remainder of the day. A responsible individual must stay with  you for 24 hours following the procedure.  For the next 24 hours, DO NOT: -Drive a car -Advertising copywriter -Drink alcoholic beverages -Take any medication unless instructed by your physician -Make any legal decisions or sign important papers.  Meals: Start with liquid foods such as gelatin or soup. Progress to regular foods as tolerated. Avoid greasy, spicy, heavy foods. If nausea  and/or vomiting occur, drink only clear liquids until the nausea and/or vomiting subsides. Call your physician if vomiting continues.  Special Instructions/Symptoms: Your throat may feel dry or sore from the anesthesia or the breathing tube placed in your throat during surgery. If this causes discomfort, gargle with warm salt water. The discomfort should disappear within 24 hours.  If you had a scopolamine patch placed behind your ear for the management of post- operative nausea and/or vomiting:  1. The medication in the patch is effective for 72 hours, after which it should be removed.  Wrap patch in a tissue and discard in the trash. Wash hands thoroughly with soap and water. 2. You may remove the patch earlier than 72 hours if you experience unpleasant side effects which may include dry mouth, dizziness or visual disturbances. 3. Avoid touching the patch. Wash your hands with soap and water after contact with the patch.    No tylenol  until after 7:00pm today.  No ibuprofen  or aleve until after 9:00pm today.

## 2024-09-23 NOTE — Anesthesia Preprocedure Evaluation (Signed)
 Anesthesia Evaluation    Reviewed: Allergy & Precautions, Patient's Chart, lab work & pertinent test results  Airway Mallampati: I  TM Distance: >3 FB Neck ROM: Full    Dental  (+) Teeth Intact, Dental Advisory Given   Pulmonary asthma    breath sounds clear to auscultation       Cardiovascular negative cardio ROS  Rhythm:Regular Rate:Normal     Neuro/Psych Seizures -, Well Controlled,  PSYCHIATRIC DISORDERS  Depression       GI/Hepatic negative GI ROS, Neg liver ROS,,,  Endo/Other  negative endocrine ROS    Renal/GU negative Renal ROS     Musculoskeletal negative musculoskeletal ROS (+)    Abdominal   Peds  Hematology  (+) Blood dyscrasia, anemia   Anesthesia Other Findings   Reproductive/Obstetrics                              Anesthesia Physical Anesthesia Plan  ASA: 2  Anesthesia Plan: General   Post-op Pain Management: Tylenol  PO (pre-op)* and Toradol IV (intra-op)*   Induction: Intravenous  PONV Risk Score and Plan: 4 or greater and Ondansetron , Dexamethasone, Midazolam and Scopolamine patch - Pre-op  Airway Management Planned: LMA  Additional Equipment: None  Intra-op Plan:   Post-operative Plan: Extubation in OR  Informed Consent: I have reviewed the patients History and Physical, chart, labs and discussed the procedure including the risks, benefits and alternatives for the proposed anesthesia with the patient or authorized representative who has indicated his/her understanding and acceptance.     Dental advisory given  Plan Discussed with: CRNA  Anesthesia Plan Comments:         Anesthesia Quick Evaluation

## 2024-09-23 NOTE — Anesthesia Postprocedure Evaluation (Signed)
 Anesthesia Post Note  Patient: Sheila Watson  Procedure(s) Performed: Excision of left breast mass (Left: Breast)     Patient location during evaluation: PACU Anesthesia Type: General Level of consciousness: awake and alert Pain management: pain level controlled Vital Signs Assessment: post-procedure vital signs reviewed and stable Respiratory status: spontaneous breathing, nonlabored ventilation, respiratory function stable and patient connected to nasal cannula oxygen Cardiovascular status: blood pressure returned to baseline and stable Postop Assessment: no apparent nausea or vomiting Anesthetic complications: no   No notable events documented.  Last Vitals:  Vitals:   09/23/24 1500 09/23/24 1515  BP: 102/62 110/63  Pulse: 84 90  Resp: 11 12  Temp:    SpO2: 100% 100%    Last Pain:  Vitals:   09/23/24 1515  TempSrc:   PainSc: 2                  Franky JONETTA Bald

## 2024-09-23 NOTE — Op Note (Signed)
 Excisional Breast Biopsy  Indications: This patient presents with history of left breast mass, biopsy proven fibroadenoma, enlarging.  Pre-operative Diagnosis: left breast mass  Post-operative Diagnosis: left breast mass  Surgeon: ARON SHOULDERS   Anesthesia: General LMA anesthesia  ASA Class: 2  Procedure Details  The patient was seen in the Holding Room. The risks, benefits, complications, treatment options, and expected outcomes were discussed with the patient. The possibilities of reaction to medication, pulmonary aspiration, bleeding, infection, the need for additional procedures, failure to diagnose a condition, and creating a complication requiring transfusion or operation were discussed with the patient. The patient concurred with the proposed plan, giving informed consent.  The site of surgery properly noted/marked. The patient was taken to Operating Room # 1, identified, and the procedure verified as Breast Excisional Biopsy. A Time Out was held and the above information confirmed.  After induction of anesthesia, the left  breast and chest were prepped and draped in standard fashion. The lumpectomy was performed by creating an oblique incision over the upper inner quadrant of the breast.  Dissection was carried down around the mass with the cautery.    Hemostasis was achieved with cautery.  Several 3-0 vicryl sutures were used to pull together the tissue to minimize the defect in the upper inner quadrant created by removal of the mass.  The wound was irrigated and closed with a 3-0 Vicryl interrupted deep dermal stitch and a 4-0 Monocryl subcuticular closure in layers.    Sterile dressings were applied. At the end of the operation, all sponge, instrument, and needle counts were correct.  Findings: grossly clear surgical margins  Estimated Blood Loss:  Minimal      Specimens: left breast mass         Complications:  None; patient tolerated the procedure well.         Disposition:  PACU - hemodynamically stable.         Condition: stable

## 2024-09-23 NOTE — Transfer of Care (Signed)
 Immediate Anesthesia Transfer of Care Note  Patient: Sheila Watson  Procedure(s) Performed: Excision of left breast mass (Left: Breast)  Patient Location: PACU  Anesthesia Type:General  Level of Consciousness: drowsy, patient cooperative, and responds to stimulation  Airway & Oxygen Therapy: Patient Spontanous Breathing and Patient connected to face mask oxygen  Post-op Assessment: Report given to RN and Post -op Vital signs reviewed and stable  Post vital signs: Reviewed and stable  Last Vitals:  Vitals Value Taken Time  BP 102/62 09/23/24 15:00  Temp    Pulse 95 09/23/24 15:02  Resp 13 09/23/24 15:02  SpO2 100 % 09/23/24 15:02  Vitals shown include unfiled device data.  Last Pain:  Vitals:   09/23/24 1249  TempSrc: Temporal  PainSc: 0-No pain         Complications: No notable events documented.

## 2024-09-25 ENCOUNTER — Ambulatory Visit: Payer: Self-pay | Admitting: General Surgery

## 2024-09-25 LAB — SURGICAL PATHOLOGY

## 2024-10-21 ENCOUNTER — Other Ambulatory Visit: Payer: Self-pay

## 2024-10-21 ENCOUNTER — Other Ambulatory Visit

## 2024-10-21 ENCOUNTER — Other Ambulatory Visit (HOSPITAL_BASED_OUTPATIENT_CLINIC_OR_DEPARTMENT_OTHER): Payer: Self-pay

## 2024-10-21 ENCOUNTER — Other Ambulatory Visit: Payer: Self-pay | Admitting: Neurology

## 2024-10-21 DIAGNOSIS — G40B09 Juvenile myoclonic epilepsy, not intractable, without status epilepticus: Secondary | ICD-10-CM | POA: Diagnosis not present

## 2024-10-21 MED ORDER — VALTOCO 15 MG DOSE 2 X 7.5 MG/0.1ML NA LQPK
NASAL | 5 refills | Status: AC
  Filled 2024-10-21: qty 10, 30d supply, fill #0

## 2024-10-22 ENCOUNTER — Other Ambulatory Visit (HOSPITAL_BASED_OUTPATIENT_CLINIC_OR_DEPARTMENT_OTHER): Payer: Self-pay

## 2024-10-23 LAB — ZONISAMIDE LEVEL: Zonisamide: 24.1 ug/mL (ref 10.0–40.0)

## 2024-10-24 ENCOUNTER — Ambulatory Visit: Admitting: Physician Assistant

## 2024-10-30 ENCOUNTER — Encounter: Payer: Self-pay | Admitting: Neurology

## 2024-10-31 ENCOUNTER — Telehealth: Payer: Self-pay | Admitting: Neurology

## 2024-10-31 NOTE — Telephone Encounter (Signed)
 Pt came in this afternoon and paid $25 for the DMV. We faxed the [facility] documents to DMV twice DMV. Thanks

## 2024-12-23 ENCOUNTER — Encounter: Payer: Self-pay | Admitting: Family Medicine

## 2024-12-23 ENCOUNTER — Ambulatory Visit: Admitting: Family Medicine

## 2024-12-23 VITALS — BP 112/75 | HR 90 | Ht 63.01 in | Wt 118.4 lb

## 2024-12-23 DIAGNOSIS — Z30011 Encounter for initial prescription of contraceptive pills: Secondary | ICD-10-CM

## 2024-12-23 DIAGNOSIS — B001 Herpesviral vesicular dermatitis: Secondary | ICD-10-CM | POA: Diagnosis not present

## 2024-12-23 DIAGNOSIS — D508 Other iron deficiency anemias: Secondary | ICD-10-CM

## 2024-12-23 DIAGNOSIS — Z0000 Encounter for general adult medical examination without abnormal findings: Secondary | ICD-10-CM | POA: Diagnosis not present

## 2024-12-23 DIAGNOSIS — Z113 Encounter for screening for infections with a predominantly sexual mode of transmission: Secondary | ICD-10-CM

## 2024-12-23 MED ORDER — NORGESTIM-ETH ESTRAD TRIPHASIC 0.18/0.215/0.25 MG-35 MCG PO TABS: 1.0000 | ORAL_TABLET | Freq: Every day | ORAL | 11 refills | Status: AC

## 2024-12-23 MED ORDER — VALACYCLOVIR HCL 1 G PO TABS: 2000.0000 mg | ORAL_TABLET | Freq: Two times a day (BID) | ORAL | 3 refills | Status: AC

## 2024-12-23 NOTE — Progress Notes (Signed)
 "  Established Patient Office Visit  Subjective   Patient ID: Sheila Watson, female    DOB: September 08, 2006  Age: 19 y.o. MRN: [id number]  Chief Complaint  Patient presents with   Annual Exam     History of Present Illness   Sheila Watson is a 19 year old female who presents for an annual physical exam and to discuss birth control options.  She has previously used various forms of birth control including oral contraceptives, a Mirena  IUD, a transdermal patch, and Depo-Provera  injections. She experienced urinary tract infections with the Mirena , did not like the patch, and had continuous bleeding with Depo-Provera . She expressed interest in using oral contraceptives again and is concerned about potential interactions with her seizure medication, Zonegran .  She has a history of cold sores on her lips, with two occurrences this year. She inquired about testing for herpes and transmission risks. She is not currently on any antiviral medication but is interested in having a prescription on hand for outbreaks.  She has a history of low ferritin levels, noted since 2021, but is not currently taking any iron  supplements due to past intolerance. She reports regular menstrual cycles without heavy bleeding. She has tried iron  supplements in the past but experienced gastrointestinal side effects.  She is considering STI testing, including gonorrhea and chlamydia, due to her sexual activity. She declined the flu vaccine during this visit.          The ASCVD Risk score (Arnett DK, et al., 2019) failed to calculate for the following reasons:   The 2019 ASCVD risk score is only valid for ages 46 to 69   * - Cholesterol units were assumed  Health Maintenance Due  Topic Date Due   HPV VACCINES (3 - Risk 3-dose series) 02/09/2018   COVID-19 Vaccine (3 - Pfizer risk series) 07/14/2020   Meningococcal B Vaccine (1 of 2 - Standard) Never done      Objective:     BP 112/75   Pulse 90   Ht 5' 3.01  (1.6 m)   Wt 118 lb 6.4 oz (53.7 kg)   LMP 12/17/2024   SpO2 100%   BMI 20.97 kg/m    Physical Exam     Gen: alert, oriented HEENT: perrla, eomi, mmm CV: rrr, no murmur Pulm: lctab. No wheeze or crackles.  GI: soft, nbs.  Nontender to palpation MSK: strength equal b/l. Normal gait Ext: no pedal edema Skin: warm and dry, no rashes Psych: pleasant affect.  Spontaneous speech       No results found for any visits on 12/23/24.      Assessment & Plan:   Physical exam, annual  Routine screening for STI (sexually transmitted infection) -     GC/Chlamydia Probe Amp  Encounter for initial prescription of contraceptive pills Assessment & Plan: Desires to restart combined oral contraceptive pills. Previous adverse effects with Mirena , transdermal patch, and Depo-Provera . Concerns about interaction with Zonegran . Discussed alternative options including Nexplanon if contraindicated. - Prescribed Sprintec if not contraindicated with Zonegran . - Referred to gynecologist for Nexplanon insertion if needed.   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 Discussed transmission, prevalence, and treatment options. Prefers episodic treatment due to infrequent outbreaks. - Prescribed antiviral medication for episodic treatment of HSV outbreaks.   Anemia, iron  deficiency, inadequate dietary intake Assessment & Plan: Chronic low ferritin levels with intolerance to oral iron  supplements. Discussed potential benefits of iron  supplementation despite normal hemoglobin levels. - Ordered repeat  ferritin test. - Will consider prescription oral iron  or IV iron  if oral is not tolerated.   Healthcare maintenance Assessment & Plan: Sexually active and due for routine STI screening. Declined flu vaccine. - Performed urine test for gonorrhea and chlamydia. - Scheduled blood test for HIV and syphilis.    Other orders -     valACYclovir  HCl; Take 2 tablets (2,000 mg  total) by mouth 2 (two) times daily for 4 days. Take for 5 days  Dispense: 4 tablet; Refill: 3 -     Norgestim-Eth Estrad Triphasic; Take 1 tablet by mouth daily.  Dispense: 28 tablet; Refill: 11      Return in about 1 year (around 12/23/2025) for physical.    Toribio MARLA Slain, MD  "

## 2024-12-23 NOTE — Patient Instructions (Addendum)
" °  VISIT SUMMARY: Today, you had your annual physical exam and we discussed your birth control options, recurrent cold sores, low iron  levels, and STI testing.  YOUR PLAN: CONTRACEPTIVE MANAGEMENT: You want to restart combined oral contraceptive pills and have had issues with other forms of birth control in the past. We also discussed your concerns about interactions with your seizure medication, Zonegran . -I prescribed Sprintec for you, provided it does not interact with Zonegran . -If Sprintec is not suitable, I referred you to a gynecologist to discuss Nexplanon insertion.  HERPES SIMPLEX VIRUS INFECTION: You have had recurrent cold sores on your lips, likely due to HSV-1, and prefer episodic treatment. -I prescribed antiviral medication for you to use during outbreaks.  IRON  DEFICIENCY: You have chronic low ferritin levels and have had trouble tolerating oral iron  supplements in the past. -I ordered a repeat ferritin test. -If your ferritin levels are still low and you cannot tolerate oral iron , we will consider IV iron .  SEXUALLY TRANSMITTED INFECTION SCREENING: You are sexually active and due for routine STI screening. -We performed a urine test for gonorrhea and chlamydia.      "

## 2024-12-24 DIAGNOSIS — B001 Herpesviral vesicular dermatitis: Secondary | ICD-10-CM | POA: Insufficient documentation

## 2024-12-24 DIAGNOSIS — Z309 Encounter for contraceptive management, unspecified: Secondary | ICD-10-CM | POA: Insufficient documentation

## 2024-12-24 DIAGNOSIS — Z0000 Encounter for general adult medical examination without abnormal findings: Secondary | ICD-10-CM | POA: Insufficient documentation

## 2024-12-24 NOTE — Assessment & Plan Note (Signed)
 Desires to restart combined oral contraceptive pills. Previous adverse effects with Mirena , transdermal patch, and Depo-Provera . Concerns about interaction with Zonegran . Discussed alternative options including Nexplanon if contraindicated. - Prescribed Sprintec if not contraindicated with Zonegran . - Referred to gynecologist for Nexplanon insertion if needed.

## 2024-12-24 NOTE — Assessment & Plan Note (Signed)
 Chronic low ferritin levels with intolerance to oral iron  supplements. Discussed potential benefits of iron  supplementation despite normal hemoglobin levels. - Ordered repeat ferritin test. - Will consider prescription oral iron  or IV iron  if oral is not tolerated.

## 2024-12-24 NOTE — Assessment & Plan Note (Signed)
 Sexually active and due for routine STI screening. Declined flu vaccine. - Performed urine test for gonorrhea and chlamydia. - Scheduled blood test for HIV and syphilis.

## 2024-12-24 NOTE — Assessment & Plan Note (Signed)
 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 Discussed transmission, prevalence, and treatment options. Prefers episodic treatment due to infrequent outbreaks. - Prescribed antiviral medication for episodic treatment of HSV outbreaks.

## 2024-12-25 ENCOUNTER — Ambulatory Visit: Payer: Self-pay | Admitting: Family Medicine

## 2024-12-25 LAB — GC/CHLAMYDIA PROBE AMP
Chlamydia trachomatis, NAA: NEGATIVE
Neisseria Gonorrhoeae by PCR: NEGATIVE

## 2025-01-13 ENCOUNTER — Ambulatory Visit: Admitting: Family Medicine

## 2025-01-16 ENCOUNTER — Other Ambulatory Visit: Payer: Self-pay | Admitting: Family Medicine

## 2025-01-16 DIAGNOSIS — R17 Unspecified jaundice: Secondary | ICD-10-CM

## 2025-01-16 DIAGNOSIS — Z113 Encounter for screening for infections with a predominantly sexual mode of transmission: Secondary | ICD-10-CM

## 2025-01-16 DIAGNOSIS — D508 Other iron deficiency anemias: Secondary | ICD-10-CM

## 2025-01-16 DIAGNOSIS — E559 Vitamin D deficiency, unspecified: Secondary | ICD-10-CM

## 2025-01-20 ENCOUNTER — Telehealth: Payer: Self-pay | Admitting: *Deleted

## 2025-01-20 NOTE — Telephone Encounter (Signed)
 Lvm we were changing her lab appt to later in the morning, it is now in a 10:30am slot but she can come in as early as 10:00am.  Office will be opening at 10am.

## 2025-01-21 ENCOUNTER — Ambulatory Visit: Payer: Self-pay

## 2025-01-21 ENCOUNTER — Other Ambulatory Visit

## 2025-01-21 NOTE — Telephone Encounter (Signed)
Left message to call back about symptoms. °

## 2025-01-27 ENCOUNTER — Ambulatory Visit: Payer: Self-pay | Admitting: Neurology

## 2025-01-28 ENCOUNTER — Other Ambulatory Visit

## 2025-12-25 ENCOUNTER — Encounter: Admitting: Family Medicine
